# Patient Record
Sex: Female | Born: 1980
Health system: Southern US, Community
[De-identification: ages and names within clinical notes are randomized; demographics above are authoritative.]

## PROBLEM LIST (undated history)

## (undated) ENCOUNTER — Inpatient Hospital Stay (HOSPITAL_COMMUNITY): Payer: Self-pay

## (undated) DIAGNOSIS — F32A Depression, unspecified: Secondary | ICD-10-CM

## (undated) DIAGNOSIS — O1490 Unspecified pre-eclampsia, unspecified trimester: Secondary | ICD-10-CM

## (undated) DIAGNOSIS — Z8619 Personal history of other infectious and parasitic diseases: Secondary | ICD-10-CM

## (undated) DIAGNOSIS — Z8742 Personal history of other diseases of the female genital tract: Secondary | ICD-10-CM

## (undated) DIAGNOSIS — N840 Polyp of corpus uteri: Secondary | ICD-10-CM

## (undated) DIAGNOSIS — K5792 Diverticulitis of intestine, part unspecified, without perforation or abscess without bleeding: Secondary | ICD-10-CM

## (undated) DIAGNOSIS — F329 Major depressive disorder, single episode, unspecified: Secondary | ICD-10-CM

## (undated) DIAGNOSIS — D649 Anemia, unspecified: Secondary | ICD-10-CM

## (undated) DIAGNOSIS — F419 Anxiety disorder, unspecified: Secondary | ICD-10-CM

## (undated) DIAGNOSIS — T7840XA Allergy, unspecified, initial encounter: Secondary | ICD-10-CM

## (undated) DIAGNOSIS — R011 Cardiac murmur, unspecified: Secondary | ICD-10-CM

## (undated) HISTORY — DX: Allergy, unspecified, initial encounter: T78.40XA

## (undated) HISTORY — PX: WISDOM TOOTH EXTRACTION: SHX21

## (undated) HISTORY — DX: Unspecified pre-eclampsia, unspecified trimester: O14.90

## (undated) HISTORY — DX: Anemia, unspecified: D64.9

## (undated) HISTORY — PX: DILATION AND CURETTAGE OF UTERUS: SHX78

## (undated) HISTORY — PX: SMALL INTESTINE SURGERY: SHX150

## (undated) HISTORY — DX: Personal history of other diseases of the female genital tract: Z87.42

## (undated) HISTORY — DX: Major depressive disorder, single episode, unspecified: F32.9

## (undated) HISTORY — PX: ABDOMINAL HYSTERECTOMY: SHX81

## (undated) HISTORY — DX: Anxiety disorder, unspecified: F41.9

## (undated) HISTORY — DX: Depression, unspecified: F32.A

## (undated) HISTORY — DX: Cardiac murmur, unspecified: R01.1

## (undated) HISTORY — DX: Personal history of other infectious and parasitic diseases: Z86.19

---

## 2006-10-12 ENCOUNTER — Emergency Department (HOSPITAL_COMMUNITY): Admission: EM | Admit: 2006-10-12 | Discharge: 2006-10-13 | Payer: Self-pay | Admitting: Emergency Medicine

## 2006-11-30 ENCOUNTER — Emergency Department (HOSPITAL_COMMUNITY): Admission: EM | Admit: 2006-11-30 | Discharge: 2006-11-30 | Payer: Self-pay | Admitting: Emergency Medicine

## 2008-11-04 ENCOUNTER — Inpatient Hospital Stay (HOSPITAL_COMMUNITY): Admission: AD | Admit: 2008-11-04 | Discharge: 2008-11-06 | Payer: Self-pay | Admitting: Obstetrics and Gynecology

## 2009-12-26 ENCOUNTER — Emergency Department (HOSPITAL_COMMUNITY): Admission: EM | Admit: 2009-12-26 | Discharge: 2009-12-27 | Payer: Self-pay | Admitting: Emergency Medicine

## 2010-04-22 ENCOUNTER — Emergency Department (HOSPITAL_COMMUNITY)
Admission: EM | Admit: 2010-04-22 | Discharge: 2010-04-22 | Payer: Self-pay | Source: Home / Self Care | Admitting: Emergency Medicine

## 2010-07-27 LAB — URINALYSIS, ROUTINE W REFLEX MICROSCOPIC
Glucose, UA: NEGATIVE mg/dL
Ketones, ur: >80 mg/dL — AB
Leukocytes, UA: NEGATIVE
Nitrite: NEGATIVE
Protein, ur: NEGATIVE mg/dL
Specific Gravity, Urine: 1.025 (ref 1.005–1.030)
Urobilinogen, UA: 0.2 mg/dL (ref 0.0–1.0)
pH: 5.5 (ref 5.0–8.0)

## 2010-07-27 LAB — URINE MICROSCOPIC-ADD ON

## 2010-07-27 LAB — COMPREHENSIVE METABOLIC PANEL
ALT: 18 U/L (ref 0–35)
AST: 26 U/L (ref 0–37)
Albumin: 4.6 g/dL (ref 3.5–5.2)
Alkaline Phosphatase: 45 U/L (ref 39–117)
BUN: 9 mg/dL (ref 6–23)
CO2: 22 mEq/L (ref 19–32)
Calcium: 9 mg/dL (ref 8.4–10.5)
Chloride: 106 mEq/L (ref 96–112)
Creatinine, Ser: 0.75 mg/dL (ref 0.4–1.2)
GFR calc Af Amer: >60 mL/min (ref >60)
GFR calc non Af Amer: >60 mL/min (ref >60)
Glucose, Bld: 112 mg/dL — ABNORMAL HIGH (ref 70–99)
Potassium: 3.8 mEq/L (ref 3.5–5.1)
Sodium: 137 mEq/L (ref 135–145)
Total Bilirubin: 1.2 mg/dL (ref 0.3–1.2)
Total Protein: 9.1 g/dL — ABNORMAL HIGH (ref 6.0–8.3)

## 2010-07-27 LAB — CBC
HCT: 41.5 % (ref 36.0–46.0)
Hemoglobin: 14 g/dL (ref 12.0–15.0)
MCH: 29.5 pg (ref 26.0–34.0)
MCHC: 33.8 g/dL (ref 30.0–36.0)
MCV: 87.3 fL (ref 78.0–100.0)
Platelets: 227 10*3/uL (ref 150–400)
RBC: 4.75 MIL/uL (ref 3.87–5.11)
RDW: 13 % (ref 11.5–15.5)
WBC: 8 10*3/uL (ref 4.0–10.5)

## 2010-07-27 LAB — URINE CULTURE
Colony Count: 8000
Culture  Setup Time: 201108170150

## 2010-07-27 LAB — DIFFERENTIAL
Basophils Absolute: 0 10*3/uL (ref 0.0–0.1)
Basophils Relative: 0 % (ref 0–1)
Eosinophils Absolute: 0 10*3/uL (ref 0.0–0.7)
Neutrophils Relative %: 93 % — ABNORMAL HIGH (ref 43–77)

## 2010-07-27 LAB — POCT PREGNANCY, URINE: Preg Test, Ur: NEGATIVE

## 2010-08-20 LAB — CBC
HCT: 27.1 % — ABNORMAL LOW (ref 36.0–46.0)
HCT: 31.6 % — ABNORMAL LOW (ref 36.0–46.0)
Hemoglobin: 9.2 g/dL — ABNORMAL LOW (ref 12.0–15.0)
MCHC: 33.5 g/dL (ref 30.0–36.0)
MCHC: 34.1 g/dL (ref 30.0–36.0)
MCV: 90.8 fL (ref 78.0–100.0)
Platelets: 184 10*3/uL (ref 150–400)
RBC: 2.97 MIL/uL — ABNORMAL LOW (ref 3.87–5.11)
RDW: 13.2 % (ref 11.5–15.5)
RDW: 13.3 % (ref 11.5–15.5)
WBC: 11.4 10*3/uL — ABNORMAL HIGH (ref 4.0–10.5)
WBC: 9.5 10*3/uL (ref 4.0–10.5)

## 2011-02-25 LAB — URINALYSIS, ROUTINE W REFLEX MICROSCOPIC
Bilirubin Urine: NEGATIVE
Glucose, UA: NEGATIVE
Ketones, ur: NEGATIVE
Protein, ur: NEGATIVE
Specific Gravity, Urine: 1.014
Urobilinogen, UA: 1
pH: 6

## 2011-02-25 LAB — URINE MICROSCOPIC-ADD ON

## 2011-02-25 LAB — POCT PREGNANCY, URINE: Operator id: 284141

## 2013-07-27 LAB — OB RESULTS CONSOLE ANTIBODY SCREEN: Antibody Screen: NEGATIVE

## 2013-07-27 LAB — OB RESULTS CONSOLE RPR: RPR: NONREACTIVE

## 2013-07-27 LAB — OB RESULTS CONSOLE HIV ANTIBODY (ROUTINE TESTING): HIV: NONREACTIVE

## 2013-07-27 LAB — OB RESULTS CONSOLE GC/CHLAMYDIA
CHLAMYDIA, DNA PROBE: NEGATIVE
Gonorrhea: NEGATIVE

## 2013-07-27 LAB — OB RESULTS CONSOLE ABO/RH: RH Type: POSITIVE

## 2013-07-27 LAB — OB RESULTS CONSOLE HEPATITIS B SURFACE ANTIGEN: Hepatitis B Surface Ag: NEGATIVE

## 2013-07-27 LAB — OB RESULTS CONSOLE RUBELLA ANTIBODY, IGM: Rubella: IMMUNE

## 2013-11-25 ENCOUNTER — Inpatient Hospital Stay (HOSPITAL_COMMUNITY)
Admission: AD | Admit: 2013-11-25 | Discharge: 2013-11-25 | Disposition: A | Discharge status: Home / Self Care | Payer: Managed Care, Other (non HMO) | Source: Ambulatory Visit | Attending: Obstetrics and Gynecology | Admitting: Obstetrics and Gynecology

## 2013-11-25 ENCOUNTER — Encounter (HOSPITAL_COMMUNITY): Payer: Self-pay | Admitting: *Deleted

## 2013-11-25 DIAGNOSIS — O26859 Spotting complicating pregnancy, unspecified trimester: Secondary | ICD-10-CM | POA: Insufficient documentation

## 2013-11-25 DIAGNOSIS — M549 Dorsalgia, unspecified: Secondary | ICD-10-CM | POA: Insufficient documentation

## 2013-11-25 DIAGNOSIS — O4690 Antepartum hemorrhage, unspecified, unspecified trimester: Secondary | ICD-10-CM

## 2013-11-25 DIAGNOSIS — O4692 Antepartum hemorrhage, unspecified, second trimester: Secondary | ICD-10-CM

## 2013-11-25 LAB — URINALYSIS, ROUTINE W REFLEX MICROSCOPIC
Bilirubin Urine: NEGATIVE
GLUCOSE, UA: NEGATIVE mg/dL
Hgb urine dipstick: NEGATIVE
KETONES UR: NEGATIVE mg/dL
LEUKOCYTES UA: NEGATIVE
NITRITE: NEGATIVE
PH: 7 (ref 5.0–8.0)
PROTEIN: NEGATIVE mg/dL
Specific Gravity, Urine: 1.02 (ref 1.005–1.030)
Urobilinogen, UA: 1 mg/dL (ref 0.0–1.0)

## 2013-11-25 LAB — WET PREP, GENITAL
CLUE CELLS WET PREP: NONE SEEN
TRICH WET PREP: NONE SEEN
YEAST WET PREP: NONE SEEN

## 2013-11-25 MED ORDER — CYCLOBENZAPRINE HCL 10 MG PO TABS: 10.0000 mg | ORAL_TABLET | Freq: Two times a day (BID) | ORAL | Status: DC | PRN
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
## 2013-11-25 NOTE — MAU Note (Signed)
Pt states that she noticed vaginal bleeding this morning at 0700. Pt describes bleeding as "spotting" and bright pink accompanied by pelvic pressure. Pt reports positive fetal movement with no changes in frequency of movement. Pt denies SROM or gushing of vaginal fluid. Patient denies contractions. Patient states she has lower back pain that has persisted through pregnancy.

## 2013-11-25 NOTE — Discharge Instructions (Signed)
Keep next scheduled Obstetrics appointment. Call primary OB office with any concerns.  Pelvic Rest Pelvic rest is sometimes recommended for women when:   The placenta is partially or completely covering the opening of the cervix (placenta previa).  There is bleeding between the uterine wall and the amniotic sac in the first trimester (subchorionic hemorrhage).  The cervix begins to open without labor starting (incompetent cervix, cervical insufficiency).  The labor is too early (preterm labor). HOME CARE INSTRUCTIONS  Do not have sexual intercourse, stimulation, or an orgasm.  Do not use tampons, douche, or put anything in the vagina.  Do not lift anything over 10 pounds (4.5 kg).  Avoid strenuous activity or straining your pelvic muscles. SEEK MEDICAL CARE IF:  You have any vaginal bleeding during pregnancy. Treat this as a potential emergency.  You have cramping pain felt low in the stomach (stronger than menstrual cramps).  You notice vaginal discharge (watery, mucus, or bloody).  You have a low, dull backache.  There are regular contractions or uterine tightening. SEEK IMMEDIATE MEDICAL CARE IF: You have vaginal bleeding and have placenta previa.  Document Released: 08/24/2010 Document Revised: 07/22/2011 Document Reviewed: 08/24/2010 [facility] Patient Information 2015 [place], [place]. This information is not intended to replace advice given to you by your health care provider. Make sure you discuss any questions you have with your health care provider.  Vaginal Bleeding During Pregnancy, Second Trimester A small amount of bleeding (spotting) from the vagina is relatively common in pregnancy. It usually stops on its own. Various things can cause bleeding or spotting in pregnancy. Some bleeding may be related to the pregnancy, and some may not. Sometimes the bleeding is normal and is not a problem. However, bleeding can also be a sign of something serious. Be sure to tell your  health care provider about any vaginal bleeding right away. Some possible causes of vaginal bleeding during the second trimester include:  Infection, inflammation, or growths on the cervix.   The placenta may be partially or completely covering the opening of the cervix inside the uterus (placenta previa).  The placenta may have separated from the uterus (abruption of the placenta).   You may be having early (preterm) labor.   The cervix may not be strong enough to keep a baby inside the uterus (cervical insufficiency).   Tiny cysts may have developed in the uterus instead of pregnancy tissue (molar pregnancy). HOME CARE INSTRUCTIONS  Watch your condition for any changes. The following actions may help to lessen any discomfort you are feeling:  Follow your health care provider's instructions for limiting your activity. If your health care provider orders bed rest, you may need to stay in bed and only get up to use the bathroom. However, your health care provider may allow you to continue light activity.  If needed, make plans for someone to help with your regular activities and responsibilities while you are on bed rest.  Keep track of the number of pads you use each day, how often you change pads, and how soaked (saturated) they are. Write this down.  Do not use tampons. Do not douche.  Do not have sexual intercourse or orgasms until approved by your health care provider.  If you pass any tissue from your vagina, save the tissue so you can show it to your health care provider.  Only take over-the-counter or prescription medicines as directed by your health care provider.  Do not take aspirin because it can make you bleed.  Do not exercise or perform any strenuous activities or heavy lifting without your health care provider's permission.  Keep all follow-up appointments as directed by your health care provider. SEEK MEDICAL CARE IF:  You have any vaginal bleeding during  any part of your pregnancy.  You have cramps or labor pains.  You have a fever, not controlled by medicine. SEEK IMMEDIATE MEDICAL CARE IF:   You have severe cramps in your back or belly (abdomen).  You have contractions.  You have chills.  You pass large clots or tissue from your vagina.  Your bleeding increases.  You feel light-headed or weak, or you have fainting episodes.  You are leaking fluid or have a gush of fluid from your vagina. MAKE SURE YOU:  Understand these instructions.  Will watch your condition.  Will get help right away if you are not doing well or get worse. Document Released: 02/06/2005 Document Revised: 05/04/2013 Document Reviewed: 01/04/2013 [facility] Patient Information 2015 [place], [place]. This information is not intended to replace advice given to you by your health care provider. Make sure you discuss any questions you have with your health care provider. Back Pain in Pregnancy Back pain during pregnancy is common. It happens in about half of all pregnancies. It is important for you and your baby that you remain active during your pregnancy.If you feel that back pain is not allowing you to remain active or sleep well, it is time to see your caregiver. Back pain may be caused by several factors related to changes during your pregnancy.Fortunately, unless you had trouble with your back before your pregnancy, the pain is likely to get better after you deliver. Low back pain usually occurs between the fifth and seventh months of pregnancy. It can, however, happen in the first couple months. Factors that increase the risk of back problems include:   Previous back problems.  Injury to your back.  Having twins or multiple births.  A chronic cough.  Stress.  Job-related repetitive motions.  Muscle or spinal disease in the back.  Family history of back problems, ruptured (herniated) discs, or osteoporosis.  Depression, anxiety, and panic  attacks. CAUSES   When you are pregnant, your body produces a hormone called relaxin. This hormonemakes the ligaments connecting the low back and pubic bones more flexible. This flexibility allows the baby to be delivered more easily. When your ligaments are loose, your muscles need to work harder to support your back. Soreness in your back can come from tired muscles. Soreness can also come from back tissues that are irritated since they are receiving less support.  As the baby grows, it puts pressure on the nerves and blood vessels in your pelvis. This can cause back pain.  As the baby grows and gets heavier during pregnancy, the uterus pushes the stomach muscles forward and changes your center of gravity. This makes your back muscles work harder to maintain good posture. SYMPTOMS  Lumbar pain during pregnancy Lumbar pain during pregnancy usually occurs at or above the waist in the center of the back. There may be pain and numbness that radiates into your leg or foot. This is similar to low back pain experienced by non-pregnant women. It usually increases with sitting for long periods of time, standing, or repetitive lifting. Tenderness may also be present in the muscles along your upper back. Posterior pelvic pain during pregnancy Pain in the back of the pelvis is more common than lumbar pain in pregnancy. It is a deep pain  felt in your side at the waistline, or across the tailbone (sacrum), or in both places. You may have pain on one or both sides. This pain can also go into the buttocks and backs of the upper thighs. Pubic and groin pain may also be present. The pain does not quickly resolve with rest, and morning stiffness may also be present. Pelvic pain during pregnancy can be brought on by most activities. A high level of fitness before and during pregnancy may or may not prevent this problem. Labor pain is usually 1 to 2 minutes apart, lasts for about 1 minute, and involves a bearing down  feeling or pressure in your pelvis. However, if you are at term with the pregnancy, constant low back pain can be the beginning of early labor, and you should be aware of this. DIAGNOSIS  X-rays of the back should not be done during the first 12 to 14 weeks of the pregnancy and only when absolutely necessary during the rest of the pregnancy. MRIs do not give off radiation and are safe during pregnancy. MRIs also should only be done when absolutely necessary. HOME CARE INSTRUCTIONS  Exercise as directed by your caregiver. Exercise is the most effective way to prevent or manage back pain. If you have a back problem, it is especially important to avoid sports that require sudden body movements. Swimming and walking are great activities.  Do not stand in one place for long periods of time.  Do not wear high heels.  Sit in chairs with good posture. Use a pillow on your lower back if necessary. Make sure your head rests over your shoulders and is not hanging forward.  Try sleeping on your side, preferably the left side, with a pillow or two between your legs. If you are sore after a night's rest, your bedmay betoo soft.Try placing a board between your mattress and box spring.  Listen to your body when lifting.If you are experiencing pain, ask for help or try bending yourknees more so you can use your leg muscles rather than your back muscles. Squat down when picking up something from the floor. Do not bend over.  Eat a healthy diet. Try to gain weight within your caregiver's recommendations.  Use heat or cold packs 3 to 4 times a day for 15 minutes to help with the pain.  Only take over-the-counter or prescription medicines for pain, discomfort, or fever as directed by your caregiver. Sudden (acute) back pain  Use bed rest for only the most extreme, acute episodes of back pain. Prolonged bed rest over 48 hours will aggravate your condition.  Ice is very effective for acute conditions.  Put  ice in a plastic bag.  Place a towel between your skin and the bag.  Leave the ice on for 10 to 20 minutes every 2 hours, or as needed.  Using heat packs for 30 minutes prior to activities is also helpful. Continued back pain See your caregiver if you have continued problems. Your caregiver can help or refer you for appropriate physical therapy. With conditioning, most back problems can be avoided. Sometimes, a more serious issue may be the cause of back pain. You should be seen right away if new problems seem to be developing. Your caregiver may recommend:  A maternity girdle.  An elastic sling.  A back brace.  A massage therapist or acupuncture. SEEK MEDICAL CARE IF:   You are not able to do most of your daily activities, even when taking the  pain medicine you were given.  You need a referral to a physical therapist or chiropractor.  You want to try acupuncture. SEEK IMMEDIATE MEDICAL CARE IF:  You develop numbness, tingling, weakness, or problems with the use of your arms or legs.  You develop severe back pain that is no longer relieved with medicines.  You have a sudden change in bowel or bladder control.  You have increasing pain in other areas of the body.  You develop shortness of breath, dizziness, or fainting.  You develop nausea, vomiting, or sweating.  You have back pain which is similar to labor pains.  You have back pain along with your water breaking or vaginal bleeding.  You have back pain or numbness that travels down your leg.  Your back pain developed after you fell.  You develop pain on one side of your back. You may have a kidney stone.  You see blood in your urine. You may have a bladder infection or kidney stone.  You have back pain with blisters. You may have shingles. Back pain is fairly common during pregnancy but should not be accepted as just part of the process. Back pain should always be treated as soon as possible. This will make your  pregnancy as pleasant as possible. Document Released: 08/07/2005 Document Revised: 07/22/2011 Document Reviewed: 09/18/2010 [facility] Patient Information 2015 [place], [place]. This information is not intended to replace advice given to you by your health care provider. Make sure you discuss any questions you have with your health care provider.

## 2013-11-25 NOTE — MAU Note (Signed)
Patient states that she feels comfortable going home and following up with her primary OB physician.

## 2014-01-28 LAB — OB RESULTS CONSOLE GBS: STREP GROUP B AG: NEGATIVE

## 2014-02-16 ENCOUNTER — Encounter (HOSPITAL_COMMUNITY): Payer: Self-pay | Admitting: *Deleted

## 2014-02-16 ENCOUNTER — Telehealth (HOSPITAL_COMMUNITY): Payer: Self-pay | Admitting: *Deleted

## 2014-02-16 NOTE — Telephone Encounter (Signed)
Preadmission screen  

## 2014-02-21 ENCOUNTER — Inpatient Hospital Stay (HOSPITAL_COMMUNITY)
Admission: AD | Admit: 2014-02-21 | Discharge: 2014-02-23 | DRG: 775 | Disposition: A | Discharge status: Home / Self Care | Payer: Managed Care, Other (non HMO) | Source: Ambulatory Visit | Attending: Obstetrics and Gynecology | Admitting: Obstetrics and Gynecology

## 2014-02-21 ENCOUNTER — Encounter (HOSPITAL_COMMUNITY): Payer: Self-pay | Admitting: General Practice

## 2014-02-21 DIAGNOSIS — Z87891 Personal history of nicotine dependence: Secondary | ICD-10-CM

## 2014-02-21 DIAGNOSIS — Z833 Family history of diabetes mellitus: Secondary | ICD-10-CM | POA: Diagnosis not present

## 2014-02-21 DIAGNOSIS — Z8249 Family history of ischemic heart disease and other diseases of the circulatory system: Secondary | ICD-10-CM | POA: Diagnosis not present

## 2014-02-21 DIAGNOSIS — Z3A39 39 weeks gestation of pregnancy: Secondary | ICD-10-CM | POA: Diagnosis present

## 2014-02-21 DIAGNOSIS — IMO0001 Reserved for inherently not codable concepts without codable children: Secondary | ICD-10-CM

## 2014-02-21 DIAGNOSIS — Z3483 Encounter for supervision of other normal pregnancy, third trimester: Secondary | ICD-10-CM | POA: Diagnosis present

## 2014-02-21 DIAGNOSIS — Z823 Family history of stroke: Secondary | ICD-10-CM

## 2014-02-21 LAB — TYPE AND SCREEN
ABO/RH(D): B POS
Antibody Screen: NEGATIVE

## 2014-02-21 LAB — CBC
HEMATOCRIT: 32.7 % — AB (ref 36.0–46.0)
HEMOGLOBIN: 10.7 g/dL — AB (ref 12.0–15.0)
MCH: 29.2 pg (ref 26.0–34.0)
MCHC: 32.7 g/dL (ref 30.0–36.0)
MCV: 89.3 fL (ref 78.0–100.0)
Platelets: 209 10*3/uL (ref 150–400)
RBC: 3.66 MIL/uL — ABNORMAL LOW (ref 3.87–5.11)
RDW: 13 % (ref 11.5–15.5)
WBC: 11.1 10*3/uL — AB (ref 4.0–10.5)

## 2014-02-21 LAB — RPR

## 2014-02-21 LAB — ABO/RH: ABO/RH(D): B POS

## 2014-02-21 MED ORDER — OXYCODONE-ACETAMINOPHEN 5-325 MG PO TABS: 2.0000 | ORAL_TABLET | ORAL | Status: DC | PRN

## 2014-02-21 MED ORDER — ONDANSETRON HCL 4 MG/2ML IJ SOLN: 4.0000 mg | Freq: Four times a day (QID) | INTRAMUSCULAR | Status: DC | PRN

## 2014-02-21 MED ORDER — LACTATED RINGERS IV SOLN: INTRAVENOUS | Status: DC

## 2014-02-21 MED ORDER — LACTATED RINGERS IV SOLN
INTRAVENOUS | Status: DC
  Administered 2014-02-21: 11:00:00 via INTRAVENOUS

## 2014-02-21 MED ORDER — EPHEDRINE 5 MG/ML INJ
10.0000 mg | INTRAVENOUS | Status: DC | PRN
  Filled 2014-02-21: qty 2

## 2014-02-21 MED ORDER — OXYTOCIN 40 UNITS IN LACTATED RINGERS INFUSION - SIMPLE MED
62.5000 mL/h | INTRAVENOUS | Status: DC
  Filled 2014-02-21: qty 1000

## 2014-02-21 MED ORDER — FLEET ENEMA 7-19 GM/118ML RE ENEM: 1.0000 | ENEMA | RECTAL | Status: DC | PRN

## 2014-02-21 MED ORDER — LANOLIN HYDROUS EX OINT: TOPICAL_OINTMENT | CUTANEOUS | Status: DC | PRN

## 2014-02-21 MED ORDER — BISACODYL 10 MG RE SUPP
10.0000 mg | Freq: Every day | RECTAL | Status: DC | PRN
Start: 2014-02-21 — End: 2014-02-23

## 2014-02-21 MED ORDER — LACTATED RINGERS IV SOLN: 500.0000 mL | INTRAVENOUS | Status: DC | PRN

## 2014-02-21 MED ORDER — BENZOCAINE-MENTHOL 20-0.5 % EX AERO
1.0000 "application " | INHALATION_SPRAY | CUTANEOUS | Status: DC | PRN
  Administered 2014-02-22: 1 via TOPICAL
  Filled 2014-02-21 (×2): qty 56

## 2014-02-21 MED ORDER — TETANUS-DIPHTH-ACELL PERTUSSIS 5-2.5-18.5 LF-MCG/0.5 IM SUSP: 0.5000 mL | Freq: Once | INTRAMUSCULAR | Status: DC

## 2014-02-21 MED ORDER — ACETAMINOPHEN 325 MG PO TABS: 650.0000 mg | ORAL_TABLET | ORAL | Status: DC | PRN

## 2014-02-21 MED ORDER — DIBUCAINE 1 % RE OINT: 1.0000 "application " | TOPICAL_OINTMENT | RECTAL | Status: DC | PRN

## 2014-02-21 MED ORDER — IBUPROFEN 600 MG PO TABS
600.0000 mg | ORAL_TABLET | Freq: Four times a day (QID) | ORAL | Status: DC
  Administered 2014-02-21 – 2014-02-22 (×6): 600 mg via ORAL
  Filled 2014-02-21 (×7): qty 1

## 2014-02-21 MED ORDER — LACTATED RINGERS IV SOLN: 500.0000 mL | Freq: Once | INTRAVENOUS | Status: DC

## 2014-02-21 MED ORDER — FENTANYL 2.5 MCG/ML BUPIVACAINE 1/10 % EPIDURAL INFUSION (WH - ANES): 14.0000 mL/h | INTRAMUSCULAR | Status: DC | PRN

## 2014-02-21 MED ORDER — DIPHENHYDRAMINE HCL 25 MG PO CAPS: 25.0000 mg | ORAL_CAPSULE | Freq: Four times a day (QID) | ORAL | Status: DC | PRN

## 2014-02-21 MED ORDER — ONDANSETRON HCL 4 MG PO TABS
4.0000 mg | ORAL_TABLET | ORAL | Status: DC | PRN
Start: 2014-02-21 — End: 2014-02-23

## 2014-02-21 MED ORDER — DIPHENHYDRAMINE HCL 50 MG/ML IJ SOLN: 12.5000 mg | INTRAMUSCULAR | Status: DC | PRN

## 2014-02-21 MED ORDER — OXYTOCIN BOLUS FROM INFUSION
500.0000 mL | INTRAVENOUS | Status: DC
  Administered 2014-02-21: 500 mL via INTRAVENOUS

## 2014-02-21 MED ORDER — CITRIC ACID-SODIUM CITRATE 334-500 MG/5ML PO SOLN: 30.0000 mL | ORAL | Status: DC | PRN

## 2014-02-21 MED ORDER — ONDANSETRON HCL 4 MG/2ML IJ SOLN
4.0000 mg | INTRAMUSCULAR | Status: DC | PRN
Start: 2014-02-21 — End: 2014-02-23

## 2014-02-21 MED ORDER — PHENYLEPHRINE 40 MCG/ML (10ML) SYRINGE FOR IV PUSH (FOR BLOOD PRESSURE SUPPORT)
80.0000 ug | PREFILLED_SYRINGE | INTRAVENOUS | Status: DC | PRN
  Filled 2014-02-21: qty 2

## 2014-02-21 MED ORDER — OXYCODONE-ACETAMINOPHEN 5-325 MG PO TABS
2.0000 | ORAL_TABLET | ORAL | Status: DC | PRN
  Administered 2014-02-21 – 2014-02-23 (×4): 2 via ORAL
  Filled 2014-02-21 (×4): qty 2

## 2014-02-21 MED ORDER — ZOLPIDEM TARTRATE 5 MG PO TABS: 5.0000 mg | ORAL_TABLET | Freq: Every evening | ORAL | Status: DC | PRN

## 2014-02-21 MED ORDER — SIMETHICONE 80 MG PO CHEW: 80.0000 mg | CHEWABLE_TABLET | ORAL | Status: DC | PRN

## 2014-02-21 MED ORDER — OXYCODONE-ACETAMINOPHEN 5-325 MG PO TABS
1.0000 | ORAL_TABLET | ORAL | Status: DC | PRN
  Administered 2014-02-21 (×2): 1 via ORAL
  Filled 2014-02-21 (×2): qty 1

## 2014-02-21 MED ORDER — SENNOSIDES-DOCUSATE SODIUM 8.6-50 MG PO TABS
2.0000 | ORAL_TABLET | ORAL | Status: DC
  Administered 2014-02-22 (×2): 2 via ORAL
  Filled 2014-02-21 (×2): qty 2

## 2014-02-21 MED ORDER — LIDOCAINE HCL (PF) 1 % IJ SOLN
30.0000 mL | INTRAMUSCULAR | Status: AC | PRN
  Administered 2014-02-21: 30 mL via SUBCUTANEOUS
  Filled 2014-02-21: qty 30

## 2014-02-21 MED ORDER — OXYCODONE-ACETAMINOPHEN 5-325 MG PO TABS: 1.0000 | ORAL_TABLET | ORAL | Status: DC | PRN

## 2014-02-21 MED ORDER — WITCH HAZEL-GLYCERIN EX PADS: 1.0000 "application " | MEDICATED_PAD | CUTANEOUS | Status: DC | PRN

## 2014-02-21 MED ORDER — FLEET ENEMA 7-19 GM/118ML RE ENEM: 1.0000 | ENEMA | Freq: Every day | RECTAL | Status: DC | PRN

## 2014-02-21 MED ORDER — PRENATAL MULTIVITAMIN CH
1.0000 | ORAL_TABLET | Freq: Every day | ORAL | Status: DC
  Administered 2014-02-22: 1 via ORAL
  Filled 2014-02-21: qty 1

## 2014-02-21 NOTE — Progress Notes (Signed)
Delivery Note At 11:58 AM a viable female was delivered via Vaginal, Spontaneous Delivery (Presentation: ; Occiput Posterior).  APGAR: 9, 9; weight .   Placenta status: Intact, Spontaneous.  Cord: 3 vessels with the following complications: None.  Cord pH: pending  Anesthesia: Local  Episiotomy: None Lacerations: 2nd degree;Perineal;Periurethral both repaired Suture Repair: 2.0 vicryl rapide Est. Blood Loss (mL):   Mom to postpartum.  Baby to Couplet care / Skin to Skin.  Jordin Vicencio II,Ladon Vandenberghe E 02/21/2014, 12:25 PM

## 2014-02-21 NOTE — H&P (Signed)
Rebecca Lane is a 33 y.o. female presenting for contractions. Maternal Medical History:  Reason for admission: Contractions.   Contractions: Onset was 1-2 hours ago.    Fetal activity: Perceived fetal activity is normal.      OB History   Grav Para Term Preterm Abortions TAB SAB Ect Mult Living   2 1 1       1      Past Medical History  Diagnosis Date  . Anemia   . Hx of ovarian cyst   . Hx of varicella   . Heart murmur    Past Surgical History  Procedure Laterality Date  . No past surgeries     Family History: family history includes Diabetes in her maternal grandmother; Hypertension in her maternal grandmother and mother; Osteoporosis in her mother; Stroke in her maternal grandmother; Thrombophlebitis in her paternal grandfather. Social History:  reports that she has quit smoking. She does not have any smokeless tobacco history on file. She reports that she does not drink alcohol or use illicit drugs.   Prenatal Transfer Tool  Maternal Diabetes: No Genetic Screening: Normal Maternal Ultrasounds/Referrals: Normal Fetal Ultrasounds or other Referrals:  None Maternal Substance Abuse:  No Significant Maternal Medications:  None Significant Maternal Lab Results:  None Other Comments:  None  ROS  Dilation: 10 Effacement (%): 100 Station: -1 Exam by:: Early, RN Blood pressure 141/82, pulse 80, temperature 98 F (36.7 C), temperature source Oral, resp. rate 20, height 5\' 9"  (1.753 m), weight 92.08 kg (203 lb), last menstrual period 05/20/2013. Maternal Exam:  Uterine Assessment: Contraction strength is firm.  Contraction frequency is regular.   Abdomen: Fetal presentation: vertex     Fetal Exam Fetal State Assessment: Category I - tracings are normal.     Physical Exam  Cardiovascular: Normal rate.   Respiratory: Effort normal.  GI: Soft.    Prenatal labs: ABO, Rh: B/Positive/-- (03/17 0000) Antibody: Negative (03/17 0000) Rubella: Immune (03/17  0000) RPR: Nonreactive (03/17 0000)  HBsAg: Negative (03/17 0000)  HIV: Non-reactive (03/17 0000)  GBS: Negative (09/18 0000)   Assessment/Plan: 33 yo G2P1 in active labor Anticipate  Vaginal delivery   Rebecca Lane Rebecca Lane,Rebecca Lane E 02/21/2014, 12:23 PM

## 2014-02-21 NOTE — MAU Note (Signed)
Per Maia Plan, RN charge, pt to go to room 168

## 2014-02-22 ENCOUNTER — Inpatient Hospital Stay (HOSPITAL_COMMUNITY): Admission: RE | Admit: 2014-02-22 | Payer: Managed Care, Other (non HMO) | Source: Ambulatory Visit

## 2014-02-22 LAB — CBC
HEMATOCRIT: 27.4 % — AB (ref 36.0–46.0)
Hemoglobin: 9.1 g/dL — ABNORMAL LOW (ref 12.0–15.0)
MCH: 29.8 pg (ref 26.0–34.0)
MCHC: 33.2 g/dL (ref 30.0–36.0)
MCV: 89.8 fL (ref 78.0–100.0)
Platelets: 188 10*3/uL (ref 150–400)
RBC: 3.05 MIL/uL — ABNORMAL LOW (ref 3.87–5.11)
RDW: 13.2 % (ref 11.5–15.5)
WBC: 13.8 10*3/uL — ABNORMAL HIGH (ref 4.0–10.5)

## 2014-02-22 NOTE — Progress Notes (Signed)
Post Partum Day 1 Subjective: no complaints, up ad lib, voiding, tolerating PO, + flatus and initially had difficulty with voiding , in and out cath x1. Denies dysuria  Objective: Blood pressure 99/51, pulse 71, temperature 98.3 F (36.8 C), temperature source Oral, resp. rate 16, height 5\' 9"  (1.753 m), weight 203 lb (92.08 kg), last menstrual period 05/20/2013, SpO2 99.00%, unknown if currently breastfeeding.  Physical Exam:  General: alert and cooperative Lochia: appropriate Uterine Fundus: firm Incision: healing well DVT Evaluation: No evidence of DVT seen on physical exam. Negative Homan's sign. No cords or calf tenderness. No significant calf/ankle edema.   Recent Labs  02/21/14 1120 02/22/14 0600  HGB 10.7* 9.1*  HCT 32.7* 27.4*    Assessment/Plan: Plan for discharge tomorrow   LOS: 1 day   CURTIS,CAROL G 02/22/2014, 8:04 AM

## 2014-02-22 NOTE — Lactation Note (Signed)
This note was copied from the chart of Rebecca Nadine Scheu. Lactation Consultation Note Mom has personal pump she is using every three to four hrs after bottle feeding. Exclusively pump and bottle feeding. Has no desire to breast feeding. Encouraged fluids, sleep, and ambulating around. Mom pump and bottle fed her 1st child. Discussed supply and demand. Mom stated she only tried for 48 hrs. The first child. Encouraged to try much longer than than. Mom encouraged to feed baby 8-12 times/24 hours and with feeding cues. Mom reports + breast changes w/pregnancy. Mom encouraged to waken baby for feeds. Mom reports + breast changes w/pregnancy. [place] brochure given w/resources, support groups and [facility] services.Mom encouraged to feed baby w/feeding cues. Educated about newborn behavior. Reviewed Baby & Me book's Breastfeeding Basics.  Patient Name: Rebecca Lane MEQAS'T Date: 02/22/2014 Reason for consult: Initial assessment   Maternal Data    Feeding    LATCH Score/Interventions                      Lactation Tools Discussed/Used Pump Review: Setup, frequency, and cleaning;Milk Storage Initiated by:: RN Date initiated:: 02/21/14   Consult Status Consult Status: Follow-up Date: 02/22/14 Follow-up type: In-patient    Jasmon Graffam, Elta Guadeloupe 02/22/2014, 2:20 AM

## 2014-02-23 MED ORDER — IBUPROFEN 600 MG PO TABS: 600.0000 mg | ORAL_TABLET | Freq: Four times a day (QID) | ORAL | Status: DC

## 2014-02-23 MED ORDER — OXYCODONE-ACETAMINOPHEN 5-325 MG PO TABS: 1.0000 | ORAL_TABLET | ORAL | Status: DC | PRN

## 2014-02-23 NOTE — Progress Notes (Signed)
Post Partum Day 2 Subjective: no complaints, up ad lib, voiding and tolerating PO  Objective: Blood pressure 102/47, pulse 72, temperature 98.4 F (36.9 C), temperature source Oral, resp. rate 16, height 5\' 9"  (1.753 m), weight 203 lb (92.08 kg), last menstrual period 05/20/2013, SpO2 100.00%, unknown if currently breastfeeding.  Physical Exam:  General: alert, cooperative and appears stated age Lochia: appropriate Uterine Fundus: firm Incision: healing well DVT Evaluation: No evidence of DVT seen on physical exam. Negative Homan's sign. No cords or calf tenderness.   Recent Labs  02/21/14 1120 02/22/14 0600  HGB 10.7* 9.1*  HCT 32.7* 27.4*    Assessment/Plan: Discharge home   LOS: 2 days   Sofya Moustafa 02/23/2014, 8:43 AM

## 2014-02-23 NOTE — Discharge Summary (Signed)
Obstetric Discharge Summary Reason for Admission: onset of labor Prenatal Procedures: none Intrapartum Procedures: spontaneous vaginal delivery Postpartum Procedures: none Complications-Operative and Postpartum: 2nd degree perineal laceration Hemoglobin  Date Value Ref Range Status  02/22/2014 9.1* 12.0 - 15.0 g/dL Final     HCT  Date Value Ref Range Status  02/22/2014 27.4* 36.0 - 46.0 % Final    Physical Exam:  General: alert, cooperative and appears stated age 33: appropriate Uterine Fundus: firm Incision: healing well DVT Evaluation: No evidence of DVT seen on physical exam. Negative Homan's sign. No cords or calf tenderness.  Discharge Diagnoses: Term Pregnancy-delivered  Discharge Information: Date: 02/23/2014 Activity: pelvic rest Diet: routine Medications: PNV, Ibuprofen and Percocet Condition: stable Instructions: refer to practice specific booklet Discharge to: home   Newborn Data: Live born female  Birth Weight: 6 lb 13.7 oz (3110 g) APGAR: 9, 9  Home with mother.  Usman Millett, [place] 02/23/2014, 8:46 AM

## 2014-02-23 NOTE — Discharge Instructions (Signed)
Call MD for T>100.4, heavy vaginal bleeding, severe abdominal pain, intractable nausea and/or vomiting, or respiratory distress.  Call office to schedule postpartum visit in 4-6 weeks.  No driving while taking narcotics.  Pelvic rest x 6 weeks.

## 2014-03-14 ENCOUNTER — Encounter (HOSPITAL_COMMUNITY): Payer: Self-pay | Admitting: General Practice

## 2015-02-01 ENCOUNTER — Ambulatory Visit (INDEPENDENT_AMBULATORY_CARE_PROVIDER_SITE_OTHER): Payer: Managed Care, Other (non HMO) | Admitting: Emergency Medicine

## 2015-02-01 ENCOUNTER — Ambulatory Visit (INDEPENDENT_AMBULATORY_CARE_PROVIDER_SITE_OTHER): Payer: Managed Care, Other (non HMO)

## 2015-02-01 VITALS — BP 120/80 | HR 90 | Temp 99.4°F | Resp 18 | Ht 67.75 in | Wt 177.0 lb

## 2015-02-01 DIAGNOSIS — R059 Cough, unspecified: Secondary | ICD-10-CM

## 2015-02-01 DIAGNOSIS — J189 Pneumonia, unspecified organism: Secondary | ICD-10-CM | POA: Diagnosis not present

## 2015-02-01 DIAGNOSIS — R05 Cough: Secondary | ICD-10-CM | POA: Diagnosis not present

## 2015-02-01 MED ORDER — LEVOFLOXACIN 500 MG PO TABS: 500.0000 mg | ORAL_TABLET | Freq: Every day | ORAL | Status: AC

## 2015-02-01 MED ORDER — HYDROCOD POLST-CPM POLST ER 10-8 MG/5ML PO SUER: 5.0000 mL | Freq: Two times a day (BID) | ORAL | Status: DC

## 2015-02-01 NOTE — Patient Instructions (Signed)

## 2015-02-01 NOTE — Progress Notes (Signed)
Subjective:  Patient ID: Rebecca Lane, female    DOB: [birth year]  Age: 34 y.o. MRN: [id number]  CC: Cough; Headache; Fever; Chills; Fatigue; and Night Sweats   HPI Rebecca Lane presents  with a cough fever or chills. She's been ill for this and has had night sweats associated with with a cough. Unwilling to doing about until she was forced to come to Dr. by her coworkers. She she has a cough productive of green sputum. She has no wheezing or asthma exertional shortness breath. She has no chest pain tightness or heaviness. She has no nausea vomiting or stool change. No rash. She has had no improvement with over-the-counter medication  History Rebecca Lane has a past medical history of Anemia; ovarian cyst; varicella; Heart murmur; Allergy; and Depression.   She has past surgical history that includes No past surgeries.   Her  family history includes Diabetes in her maternal grandmother; Hypertension in her maternal grandmother and mother; Osteoporosis in her mother; Stroke in her maternal grandmother; Thrombophlebitis in her paternal grandfather.  She   reports that she has quit smoking. She does not have any smokeless tobacco history on file. She reports that she drinks about 0.6 oz of alcohol per week. She reports that she does not use illicit drugs.  Outpatient Prescriptions Prior to Visit  Medication Sig Dispense Refill  . acetaminophen (TYLENOL) 500 MG tablet Take 1,000 mg by mouth every 6 (six) hours as needed for mild pain or headache.     . cyclobenzaprine (FLEXERIL) 10 MG tablet Take 1 tablet (10 mg total) by mouth 2 (two) times daily as needed for muscle spasms. 40 tablet 1  . Fe Cbn-Fe Gluc-FA-B12-C-DSS (FERRALET 90) 90-1 MG TABS Take 1 tablet by mouth daily.    . calcium carbonate (TUMS - DOSED IN MG ELEMENTAL CALCIUM) 500 MG chewable tablet Chew 1 tablet by mouth 3 (three) times daily as needed for indigestion or heartburn.     Marland Kitchen ibuprofen (ADVIL,MOTRIN) 600 MG tablet Take 1  tablet (600 mg total) by mouth every 6 (six) hours. (Patient not taking: Reported on 02/01/2015) 30 tablet 0  . oxyCODONE-acetaminophen (PERCOCET/ROXICET) 5-325 MG per tablet Take 1 tablet by mouth every 4 (four) hours as needed (for pain scale less than 7). (Patient not taking: Reported on 02/01/2015) 20 tablet 0  . Prenatal Vit-Fe Fumarate-FA (PRENATAL MULTIVITAMIN) TABS tablet Take 1 tablet by mouth daily.     Marland Kitchen zolpidem (AMBIEN) 10 MG tablet Take 10 mg by mouth at bedtime as needed for sleep.     No facility-administered medications prior to visit.    Social History   Social History  . Marital Status: Married    Spouse Name: N/A  . Number of Children: N/A  . Years of Education: N/A   Social History Main Topics  . Smoking status: Former Research scientist (life sciences)  . Smokeless tobacco: None  . Alcohol Use: 0.6 oz/week    1 Standard drinks or equivalent per week  . Drug Use: No  . Sexual Activity: Not Asked   Other Topics Concern  . None   Social History Narrative     Review of Systems  Constitutional: Negative for fever, chills and appetite change.  HENT: Negative for congestion, ear pain, postnasal drip, sinus pressure and sore throat.   Eyes: Negative for pain and redness.  Respiratory: Positive for cough. Negative for shortness of breath and wheezing.   Cardiovascular: Negative for leg swelling.  Gastrointestinal: Negative for nausea, vomiting, abdominal pain,  diarrhea, constipation and blood in stool.  Endocrine: Negative for polyuria.  Genitourinary: Negative for dysuria, urgency, frequency and flank pain.  Musculoskeletal: Negative for gait problem.  Skin: Negative for rash.  Neurological: Negative for weakness and headaches.  Psychiatric/Behavioral: Negative for confusion and decreased concentration. The patient is not nervous/anxious.     Objective:  BP 120/80 mmHg  Pulse 90  Temp(Src) 99.4 F (37.4 C) (Oral)  Resp 18  Ht 5' 7.75" (1.721 m)  Wt 177 lb (80.287 kg)  BMI 27.11  kg/m2  SpO2 98%  LMP 01/18/2015 (Approximate)  Breastfeeding? No  Physical Exam  Constitutional: She is oriented to person, place, and time. She appears well-developed and well-nourished. No distress.  HENT:  Head: Normocephalic and atraumatic.  Right Ear: External ear normal.  Left Ear: External ear normal.  Nose: Nose normal.  Eyes: Conjunctivae and EOM are normal. Pupils are equal, round, and reactive to light. No scleral icterus.  Neck: Normal range of motion. Neck supple. No tracheal deviation present.  Cardiovascular: Normal rate, regular rhythm and normal heart sounds.   Pulmonary/Chest: Effort normal. No respiratory distress. She has no wheezes. She has rales (In the lung bases).  Abdominal: She exhibits no mass. There is no tenderness. There is no rebound and no guarding.  Musculoskeletal: She exhibits no edema.  Lymphadenopathy:    She has no cervical adenopathy.  Neurological: She is alert and oriented to person, place, and time. Coordination normal.  Skin: Skin is warm and dry. No rash noted.  Psychiatric: She has a normal mood and affect. Her behavior is normal.      Assessment & Plan:   Rebecca Lane was seen today for cough, headache, fever, chills, fatigue and night sweats.  Diagnoses and all orders for this visit:  CAP (community acquired pneumonia)  Cough -     DG Chest 2 View; Future  Other orders -     chlorpheniramine-HYDROcodone (TUSSIONEX PENNKINETIC ER) 10-8 MG/5ML SUER; Take 5 mLs by mouth 2 (two) times daily. -     levofloxacin (LEVAQUIN) 500 MG tablet; Take 1 tablet (500 mg total) by mouth daily.   I am having Rebecca Lane start on chlorpheniramine-HYDROcodone and levofloxacin. I am also having her maintain her prenatal multivitamin, calcium carbonate, acetaminophen, cyclobenzaprine, FERRALET 90, zolpidem, oxyCODONE-acetaminophen, ibuprofen, and drospirenone-ethinyl estradiol.  Meds ordered this encounter  Medications  . drospirenone-ethinyl estradiol  (OCELLA) 3-0.03 MG tablet    Sig: Take 1 tablet by mouth daily.  . chlorpheniramine-HYDROcodone (TUSSIONEX PENNKINETIC ER) 10-8 MG/5ML SUER    Sig: Take 5 mLs by mouth 2 (two) times daily.    Dispense:  60 mL    Refill:  0  . levofloxacin (LEVAQUIN) 500 MG tablet    Sig: Take 1 tablet (500 mg total) by mouth daily.    Dispense:  10 tablet    Refill:  0    Appropriate red flag conditions were discussed with the patient as well as actions that should be taken.  Patient expressed his understanding.  Follow-up: No Follow-up on file.  Roselee Culver, MD   [facility] reading (PRIMARY) by  Dr. Ouida Sills left lower lobe pneumonia.

## 2015-07-09 ENCOUNTER — Ambulatory Visit (INDEPENDENT_AMBULATORY_CARE_PROVIDER_SITE_OTHER): Payer: 59 | Admitting: Urgent Care

## 2015-07-09 VITALS — BP 116/74 | HR 82 | Temp 98.4°F | Resp 12 | Ht 67.0 in | Wt 187.0 lb

## 2015-07-09 DIAGNOSIS — J3489 Other specified disorders of nose and nasal sinuses: Secondary | ICD-10-CM

## 2015-07-09 DIAGNOSIS — R0981 Nasal congestion: Secondary | ICD-10-CM

## 2015-07-09 DIAGNOSIS — H65191 Other acute nonsuppurative otitis media, right ear: Secondary | ICD-10-CM | POA: Diagnosis not present

## 2015-07-09 MED ORDER — CETIRIZINE HCL 10 MG PO TABS: 10.0000 mg | ORAL_TABLET | Freq: Every day | ORAL | Status: DC

## 2015-07-09 MED ORDER — AZITHROMYCIN 250 MG PO TABS: ORAL_TABLET | ORAL | Status: DC

## 2015-07-09 NOTE — Progress Notes (Signed)
    MRN: [id number] DOB: 1981-03-27  Subjective:   Rebecca Lane is a 35 y.o. female presenting for chief complaint of Ear Fullness  Reports ~2 week history of right ear fullness, ear pain, decreased hearing, now having 1 day history of right sided facial pressure, sinus pain, headache. Has used peroxide drops, otc wax removal. Denies fever, cough, sore throat, tooth pain. Denies history of chronic ear infections or tympanostomy. Patient has not been swimming recently.  Leo has a current medication list which includes the following prescription(s): carbonyl iron, drospirenone-ethinyl estradiol, and ferralet 90. Also is allergic to penicillins.  Anallely  has a past medical history of Anemia; ovarian cyst; varicella; Heart murmur; Allergy; and Depression. Also  has past surgical history that includes No past surgeries.  Objective:   Vitals: BP 116/74 mmHg  Pulse 82  Temp(Src) 98.4 F (36.9 C) (Oral)  Resp 12  Ht 5\' 7"  (1.702 m)  Wt 187 lb (84.823 kg)  BMI 29.28 kg/m2  SpO2 99%  LMP 07/09/2015  Physical Exam  Constitutional: She is oriented to person, place, and time. She appears well-developed and well-nourished.  HENT:  Right TM with yellowish-brown tinge, non-tender, no drainage of pus or bleeding. Left TM normal. Nasal turbinates pink and moist. No sinus tenderness. Oropharynx clear.  Eyes: Right eye exhibits no discharge. Left eye exhibits no discharge. No scleral icterus.  Neck: Normal range of motion. Neck supple.  Cardiovascular: Normal rate.   Pulmonary/Chest: Effort normal.  Musculoskeletal: She exhibits no edema.  Lymphadenopathy:    She has no cervical adenopathy.  Neurological: She is alert and oriented to person, place, and time.  Skin: Skin is warm and dry.   Assessment and Plan :   1. Acute nonsuppurative otitis media of right ear 2. Sinus congestion 3. Sinus pain - Due to penicillin allergy, will start Azithromycin. Also recommended Zyrtec to address nasal  congestion. Patient is to call and let me know if no improvement in 3-4 days, consider switching to Cipro or referral to ENT for further management. Patient agreed.  Jaynee Eagles, PA-C [facility] [phone] 07/09/2015 1:41 PM

## 2015-07-09 NOTE — Patient Instructions (Signed)

## 2016-03-06 ENCOUNTER — Encounter (HOSPITAL_COMMUNITY): Payer: Self-pay | Admitting: Emergency Medicine

## 2016-03-06 ENCOUNTER — Emergency Department (HOSPITAL_COMMUNITY): Payer: 59

## 2016-03-06 ENCOUNTER — Emergency Department (HOSPITAL_COMMUNITY)
Admission: EM | Admit: 2016-03-06 | Discharge: 2016-03-06 | Disposition: A | Discharge status: Home / Self Care | Payer: 59 | Attending: Emergency Medicine | Admitting: Emergency Medicine

## 2016-03-06 ENCOUNTER — Ambulatory Visit (INDEPENDENT_AMBULATORY_CARE_PROVIDER_SITE_OTHER): Payer: 59 | Admitting: Family Medicine

## 2016-03-06 VITALS — BP 124/80 | HR 91 | Temp 98.6°F | Resp 17 | Ht 67.0 in | Wt 187.0 lb

## 2016-03-06 DIAGNOSIS — R1013 Epigastric pain: Secondary | ICD-10-CM | POA: Diagnosis present

## 2016-03-06 DIAGNOSIS — Z79899 Other long term (current) drug therapy: Secondary | ICD-10-CM | POA: Insufficient documentation

## 2016-03-06 DIAGNOSIS — K5792 Diverticulitis of intestine, part unspecified, without perforation or abscess without bleeding: Secondary | ICD-10-CM | POA: Insufficient documentation

## 2016-03-06 DIAGNOSIS — R1033 Periumbilical pain: Secondary | ICD-10-CM | POA: Diagnosis not present

## 2016-03-06 DIAGNOSIS — Z87891 Personal history of nicotine dependence: Secondary | ICD-10-CM | POA: Diagnosis not present

## 2016-03-06 LAB — COMPREHENSIVE METABOLIC PANEL
ALBUMIN: 4.5 g/dL (ref 3.5–5.0)
ALK PHOS: 38 U/L (ref 38–126)
ALT: 14 U/L (ref 14–54)
AST: 17 U/L (ref 15–41)
Anion gap: 7 (ref 5–15)
BILIRUBIN TOTAL: 1.3 mg/dL — AB (ref 0.3–1.2)
BUN: 6 mg/dL (ref 6–20)
CO2: 22 mmol/L (ref 22–32)
CREATININE: 0.65 mg/dL (ref 0.44–1.00)
Calcium: 8.6 mg/dL — ABNORMAL LOW (ref 8.9–10.3)
Chloride: 103 mmol/L (ref 101–111)
GFR calc Af Amer: >60 mL/min (ref >60)
GLUCOSE: 97 mg/dL (ref 65–99)
Potassium: 3.7 mmol/L (ref 3.5–5.1)
Sodium: 132 mmol/L — ABNORMAL LOW (ref 135–145)
Total Protein: 8.1 g/dL (ref 6.5–8.1)

## 2016-03-06 LAB — I-STAT BETA HCG BLOOD, ED (MC, WL, AP ONLY): I-stat hCG, quantitative: <5 m[IU]/mL (ref <5)

## 2016-03-06 LAB — CBC
HEMATOCRIT: 37.8 % (ref 36.0–46.0)
Hemoglobin: 12.2 g/dL (ref 12.0–15.0)
MCH: 28.9 pg (ref 26.0–34.0)
MCHC: 32.3 g/dL (ref 30.0–36.0)
MCV: 89.6 fL (ref 78.0–100.0)
Platelets: 240 10*3/uL (ref 150–400)
RBC: 4.22 MIL/uL (ref 3.87–5.11)
RDW: 13.1 % (ref 11.5–15.5)
WBC: 11.3 10*3/uL — AB (ref 4.0–10.5)

## 2016-03-06 LAB — URINALYSIS, ROUTINE W REFLEX MICROSCOPIC
BILIRUBIN URINE: NEGATIVE
GLUCOSE, UA: NEGATIVE mg/dL
Ketones, ur: 40 mg/dL — AB
Leukocytes, UA: NEGATIVE
NITRITE: NEGATIVE
Protein, ur: NEGATIVE mg/dL
SPECIFIC GRAVITY, URINE: 1.011 (ref 1.005–1.030)
pH: 6 (ref 5.0–8.0)

## 2016-03-06 LAB — URINE MICROSCOPIC-ADD ON

## 2016-03-06 LAB — LIPASE, BLOOD: Lipase: 18 U/L (ref 11–51)

## 2016-03-06 MED ORDER — CIPROFLOXACIN HCL 500 MG PO TABS: 500.0000 mg | ORAL_TABLET | Freq: Two times a day (BID) | ORAL | 0 refills | Status: DC

## 2016-03-06 MED ORDER — CEPHALEXIN 500 MG PO CAPS
500.0000 mg | ORAL_CAPSULE | Freq: Once | ORAL | Status: AC
  Administered 2016-03-06: 500 mg via ORAL
  Filled 2016-03-06: qty 1

## 2016-03-06 MED ORDER — PANTOPRAZOLE SODIUM 40 MG IV SOLR
40.0000 mg | Freq: Once | INTRAVENOUS | Status: AC
  Administered 2016-03-06: 40 mg via INTRAVENOUS
  Filled 2016-03-06: qty 40

## 2016-03-06 MED ORDER — IOPAMIDOL (ISOVUE-300) INJECTION 61%
15.0000 mL | Freq: Once | INTRAVENOUS | Status: AC | PRN
  Administered 2016-03-06: 15 mL via ORAL

## 2016-03-06 MED ORDER — ONDANSETRON HCL 4 MG/2ML IJ SOLN
4.0000 mg | Freq: Once | INTRAMUSCULAR | Status: AC
  Administered 2016-03-06: 4 mg via INTRAVENOUS
  Filled 2016-03-06: qty 2

## 2016-03-06 MED ORDER — METRONIDAZOLE 500 MG PO TABS
500.0000 mg | ORAL_TABLET | Freq: Once | ORAL | Status: AC
  Administered 2016-03-06: 500 mg via ORAL
  Filled 2016-03-06: qty 1

## 2016-03-06 MED ORDER — ONDANSETRON 4 MG PO TBDP: 4.0000 mg | ORAL_TABLET | Freq: Three times a day (TID) | ORAL | 0 refills | Status: DC | PRN

## 2016-03-06 MED ORDER — HYDROMORPHONE HCL 1 MG/ML IJ SOLN
1.0000 mg | Freq: Once | INTRAMUSCULAR | Status: AC
  Administered 2016-03-06: 1 mg via INTRAVENOUS
  Filled 2016-03-06: qty 1

## 2016-03-06 MED ORDER — HYDROCODONE-ACETAMINOPHEN 5-325 MG PO TABS: 1.0000 | ORAL_TABLET | Freq: Four times a day (QID) | ORAL | 0 refills | Status: DC | PRN

## 2016-03-06 MED ORDER — LORAZEPAM 2 MG/ML IJ SOLN
0.5000 mg | Freq: Once | INTRAMUSCULAR | Status: AC
  Administered 2016-03-06: 0.5 mg via INTRAVENOUS
  Filled 2016-03-06: qty 1

## 2016-03-06 MED ORDER — IOPAMIDOL (ISOVUE-300) INJECTION 61%
100.0000 mL | Freq: Once | INTRAVENOUS | Status: AC | PRN
  Administered 2016-03-06: 100 mL via INTRAVENOUS

## 2016-03-06 MED ORDER — METRONIDAZOLE 500 MG PO TABS: 500.0000 mg | ORAL_TABLET | Freq: Two times a day (BID) | ORAL | 0 refills | Status: DC

## 2016-03-06 NOTE — ED Notes (Signed)
Pt being sent by Urgent [facility].  C/o epigastric pain x 2 days and nausea.  Pt reported to UC that pain increased after eating.

## 2016-03-06 NOTE — Progress Notes (Signed)
Patient listed as having [employer] without a pcp.  EDCM went to speak to patient at bedside, however, nurse at bedside performing procedure.

## 2016-03-06 NOTE — Patient Instructions (Addendum)
Great to meet you!  Please go to the emergency room to be evaluated, I feel confident you need a CT of your abdomen.    IF you received an x-ray today, you will receive an invoice from [facility]. Please contact [facility] at [phone] with questions or concerns regarding your invoice.   IF you received labwork today, you will receive an invoice from [employer]. Please contact [facility] at [phone] with questions or concerns regarding your invoice.   Our billing staff will not be able to assist you with questions regarding bills from these companies.  You will be contacted with the lab results as soon as they are available. The fastest way to get your results is to activate your My Chart account. Instructions are located on the last page of this paperwork. If you have not heard from [place] regarding the results in 2 weeks, please contact this office.

## 2016-03-06 NOTE — ED Notes (Signed)
Po challenge initiated, water given, patient instructed to take small sips.

## 2016-03-06 NOTE — ED Provider Notes (Signed)
I received pt in signout from Dr. Zenia Resides. We were awaiting CT results, which showed diverticulitis without any complications. Patient well-appearing on reexamination. Given her normal vital signs, reassuring lab work, and no evidence of perforation on CT, I feel she is appropriate for outpatient management of diverticulitis with Cipro and Flagyl. Gave her dose in the ED and discussed supportive care. I extensively reviewed return precautions including any worsening symptoms. She voiced understanding and was discharged in satisfactory condition.   Sharlett Iles, MD 03/06/16 2110

## 2016-03-06 NOTE — ED Notes (Signed)
Pt cannot use restroom at this time, aware urine specimen is needed.  

## 2016-03-06 NOTE — Progress Notes (Addendum)
   HPI  Patient presents today here with abdominal pain.  Patient explains that 2 days ago she began having epigastric abdominal pain which has now migrated to her periumbilical area. She has severe worsening with eating or drinking. She has loose stools with 4 episodes daily since onset, no vomiting but she does have emesis.  Her stools are nonbloody and non-melanotic.  She has no fevers but she does have chills.  Her pain has been worsening today.  She took a cimetidine with a small amount of relief last night and this morning.  She denies any new foods, she has not drank alcohol recently, she is not a smoker. She has never had similar pain.   PMH: Smoking status noted ROS: Per HPI  Objective: BP 124/80 (BP Location: Right Arm, Patient Position: Sitting, Cuff Size: Normal)   Pulse 91   Temp 98.6 F (37 C) (Oral)   Resp 17   Ht 5\' 7"  (1.702 m)   Wt 187 lb (84.8 kg)   LMP 02/14/2016 (Approximate)   SpO2 100%   BMI 29.29 kg/m  Gen: NAD, alert, cooperative with exam HEENT: NCAT, tearful during abdominal exam, oropharynx moist  CV: RRR, good S1/S2, no murmur Resp: CTABL, no wheezes, non-labored [id number], tenderness to palpation throughout but most severe in the right lower quadrant, para umbilical area, and left lower quadrant. guarding throughout, positive bowel sounds  Ext: No edema, warm Neuro: Alert and oriented, No gross deficits  Assessment and plan:  # Abdominal pain, periumbilical Severe worsening abdominal pain, her abdominal exam is concerning for serious intra-abdominal etiology. On exam she is guarding and tearful, her pain is mostly centered around the periumbilical area currently causing concern for acute appendicitis. consider gastritis as well given improvent with H2 blocker, however given the severity of her pain with exam I'm uncomfortable sending her home without further evaluation. I have recommended transfer to the emergency room by car which she agrees  to do.    Meds ordered this encounter  Medications  . cimetidine (TAGAMET) 200 MG tablet    Sig: Take 200 mg by mouth 2 (two) times daily.    Laroy Apple, MD  03/06/2016, 3:30 PM

## 2016-03-06 NOTE — Discharge Instructions (Signed)
Do not drink alcohol while taking these antibiotics. Return immediately if you have worsening symptoms including worsening abdominal pain, fever, blood in your stool, or severe vomiting.

## 2016-03-06 NOTE — ED Triage Notes (Addendum)
Pt c/o abdominal pain in center but is now more of a sore pain onset 2 days ago. States laying on her back and when she eats/drinks it  Increases her pain level. Pt c/o nausea and diarrhea. Pt was at [facility] and sent her here for a CT scan. No lab work was done at [facility].

## 2016-03-06 NOTE — ED Provider Notes (Signed)
[facility] DEPT Provider Note   CSN: [id number] Arrival date & time: 03/06/16  1554     History   Chief Complaint Chief Complaint  Patient presents with  . Abdominal Pain    HPI Rebecca Lane is a 35 y.o. female.  35 year old female presents with 2 days of epigastric abdominal pain that is worse with eating does not radiate to her periumbilical area as well as right lower quadrant. She's had nausea but no vomiting. Some diarrhea. Denies any fever or chills. No vaginal bleeding or discharge. Denies any urinary symptoms. Was seen in urgent care center and then that was reviewed. She was sent here for possible appendicitis.      Past Medical History:  Diagnosis Date  . Allergy   . Anemia   . Depression   . Heart murmur   . Hx of ovarian cyst   . Hx of varicella     There are no active problems to display for this patient.   Past Surgical History:  Procedure Laterality Date  . NO PAST SURGERIES      OB History    Gravida Para Term Preterm AB Living   2 2 2     2    SAB TAB Ectopic Multiple Live Births           2       Home Medications    Prior to Admission medications   Medication Sig Start Date End Date Taking? Authorizing Provider  Carbonyl Iron (FEOSOL PO) Take by mouth daily.    Historical Provider, MD  cimetidine (TAGAMET) 200 MG tablet Take 200 mg by mouth 2 (two) times daily.    Historical Provider, MD  drospirenone-ethinyl estradiol (OCELLA) 3-0.03 MG tablet Take 1 tablet by mouth daily.    Historical Provider, MD    Family History Family History  Problem Relation Age of Onset  . Hypertension Mother   . Osteoporosis Mother   . Stroke Maternal Grandmother   . Hypertension Maternal Grandmother   . Diabetes Maternal Grandmother   . Thrombophlebitis Paternal Grandfather     Social History Social History  Substance Use Topics  . Smoking status: Former Research scientist (life sciences)  . Smokeless tobacco: Not on file  . Alcohol use 0.6 oz/week    1 Standard  drinks or equivalent per week     Allergies   Penicillins   Review of Systems Review of Systems  All other systems reviewed and are negative.    Physical Exam Updated Vital Signs BP 125/82 (BP Location: Left Arm)   Pulse 85   Temp 98.6 F (37 C) (Oral)   Resp 18   Ht 5\' 7"  (1.702 m)   Wt 88.6 kg   LMP 02/14/2016 (Approximate)   SpO2 99%   BMI 30.58 kg/m   Physical Exam  Constitutional: She is oriented to person, place, and time. She appears well-developed and well-nourished.  Non-toxic appearance. No distress.  HENT:  Head: Normocephalic and atraumatic.  Eyes: Conjunctivae, EOM and lids are normal. Pupils are equal, round, and reactive to light.  Neck: Normal range of motion. Neck supple. No tracheal deviation present. No thyroid mass present.  Cardiovascular: Normal rate, regular rhythm and normal heart sounds.  Exam reveals no gallop.   No murmur heard. Pulmonary/Chest: Effort normal and breath sounds normal. No stridor. No respiratory distress. She has no decreased breath sounds. She has no wheezes. She has no rhonchi. She has no rales.  Abdominal: Soft. Normal appearance and bowel sounds are normal.  She exhibits no distension. There is tenderness in the right lower quadrant and epigastric area. There is guarding. There is no rigidity, no rebound and no CVA tenderness.  Musculoskeletal: Normal range of motion. She exhibits no edema or tenderness.  Neurological: She is alert and oriented to person, place, and time. She has normal strength. No cranial nerve deficit or sensory deficit. GCS eye subscore is 4. GCS verbal subscore is 5. GCS motor subscore is 6.  Skin: Skin is warm and dry. No abrasion and no rash noted.  Psychiatric: She has a normal mood and affect. Her speech is normal and behavior is normal.  Nursing note and vitals reviewed.    ED Treatments / Results  Labs (all labs ordered are listed, but only abnormal results are displayed) Labs Reviewed    COMPREHENSIVE METABOLIC PANEL - Abnormal; Notable for the following:       Result Value   Sodium 132 (*)    Calcium 8.6 (*)    Total Bilirubin 1.3 (*)    All other components within normal limits  CBC - Abnormal; Notable for the following:    WBC 11.3 (*)    All other components within normal limits  LIPASE, BLOOD  URINALYSIS, ROUTINE W REFLEX MICROSCOPIC (NOT AT [facility])  I-STAT BETA HCG BLOOD, ED ([facility], [facility], [facility] ONLY)    EKG  EKG Interpretation None       Radiology No results found.  Procedures Procedures (including critical care time)  Medications Ordered in ED Medications  iopamidol (ISOVUE-300) 61 % injection 15 mL (not administered)  HYDROmorphone (DILAUDID) injection 1 mg (not administered)  LORazepam (ATIVAN) injection 0.5 mg (not administered)  ondansetron (ZOFRAN) injection 4 mg (not administered)     Initial Impression / Assessment and Plan / ED Course  I have reviewed the triage vital signs and the nursing notes.  Pertinent labs & imaging results that were available during my care of the patient were reviewed by me and considered in my medical decision making (see chart for details).  Clinical Course    Patient medicated for pain. Abdominal CT ordered and results will be pending at this time and care signed out to oncoming provider  Final Clinical Impressions(s) / ED Diagnoses   Final diagnoses:  None    New Prescriptions New Prescriptions   No medications on file     Lacretia Leigh, MD 03/06/16 1724

## 2016-04-03 ENCOUNTER — Emergency Department (HOSPITAL_COMMUNITY)
Admission: EM | Admit: 2016-04-03 | Discharge: 2016-04-03 | Disposition: A | Discharge status: Home / Self Care | Payer: 59 | Attending: Emergency Medicine | Admitting: Emergency Medicine

## 2016-04-03 ENCOUNTER — Emergency Department (HOSPITAL_COMMUNITY): Payer: 59

## 2016-04-03 ENCOUNTER — Encounter (HOSPITAL_COMMUNITY): Payer: Self-pay | Admitting: Emergency Medicine

## 2016-04-03 DIAGNOSIS — R197 Diarrhea, unspecified: Secondary | ICD-10-CM | POA: Diagnosis not present

## 2016-04-03 DIAGNOSIS — Z87891 Personal history of nicotine dependence: Secondary | ICD-10-CM | POA: Insufficient documentation

## 2016-04-03 DIAGNOSIS — R112 Nausea with vomiting, unspecified: Secondary | ICD-10-CM

## 2016-04-03 DIAGNOSIS — Z79899 Other long term (current) drug therapy: Secondary | ICD-10-CM | POA: Diagnosis not present

## 2016-04-03 DIAGNOSIS — R1013 Epigastric pain: Secondary | ICD-10-CM | POA: Diagnosis not present

## 2016-04-03 HISTORY — DX: Diverticulitis of intestine, part unspecified, without perforation or abscess without bleeding: K57.92

## 2016-04-03 LAB — WET PREP, GENITAL
Clue Cells Wet Prep HPF POC: NONE SEEN
SPERM: NONE SEEN
Trich, Wet Prep: NONE SEEN
Yeast Wet Prep HPF POC: NONE SEEN

## 2016-04-03 LAB — URINALYSIS, ROUTINE W REFLEX MICROSCOPIC
Bilirubin Urine: NEGATIVE
Glucose, UA: NEGATIVE mg/dL
Ketones, ur: 15 mg/dL — AB
LEUKOCYTES UA: NEGATIVE
NITRITE: NEGATIVE
PROTEIN: NEGATIVE mg/dL
Specific Gravity, Urine: 1.021 (ref 1.005–1.030)
pH: 5.5 (ref 5.0–8.0)

## 2016-04-03 LAB — CBC
HCT: 39.5 % (ref 36.0–46.0)
Hemoglobin: 12.7 g/dL (ref 12.0–15.0)
MCH: 28.6 pg (ref 26.0–34.0)
MCHC: 32.2 g/dL (ref 30.0–36.0)
MCV: 89 fL (ref 78.0–100.0)
PLATELETS: 203 10*3/uL (ref 150–400)
RBC: 4.44 MIL/uL (ref 3.87–5.11)
RDW: 13.4 % (ref 11.5–15.5)
WBC: 6.2 10*3/uL (ref 4.0–10.5)

## 2016-04-03 LAB — COMPREHENSIVE METABOLIC PANEL
ALT: 14 U/L (ref 14–54)
AST: 18 U/L (ref 15–41)
Albumin: 4.6 g/dL (ref 3.5–5.0)
Alkaline Phosphatase: 41 U/L (ref 38–126)
Anion gap: 7 (ref 5–15)
BILIRUBIN TOTAL: 1 mg/dL (ref 0.3–1.2)
BUN: 9 mg/dL (ref 6–20)
CALCIUM: 8.7 mg/dL — AB (ref 8.9–10.3)
CHLORIDE: 108 mmol/L (ref 101–111)
CO2: 21 mmol/L — ABNORMAL LOW (ref 22–32)
CREATININE: 0.68 mg/dL (ref 0.44–1.00)
Glucose, Bld: 91 mg/dL (ref 65–99)
Potassium: 3.8 mmol/L (ref 3.5–5.1)
Sodium: 136 mmol/L (ref 135–145)
TOTAL PROTEIN: 8.1 g/dL (ref 6.5–8.1)

## 2016-04-03 LAB — URINE MICROSCOPIC-ADD ON

## 2016-04-03 LAB — LIPASE, BLOOD: LIPASE: 23 U/L (ref 11–51)

## 2016-04-03 LAB — POC URINE PREG, ED: PREG TEST UR: NEGATIVE

## 2016-04-03 MED ORDER — HYDROCODONE-ACETAMINOPHEN 5-325 MG PO TABS: 1.0000 | ORAL_TABLET | Freq: Four times a day (QID) | ORAL | 0 refills | Status: DC | PRN

## 2016-04-03 MED ORDER — ONDANSETRON HCL 4 MG PO TABS: 4.0000 mg | ORAL_TABLET | Freq: Four times a day (QID) | ORAL | 0 refills | Status: DC

## 2016-04-03 MED ORDER — ONDANSETRON HCL 4 MG/2ML IJ SOLN
4.0000 mg | Freq: Once | INTRAMUSCULAR | Status: AC
  Administered 2016-04-03: 4 mg via INTRAVENOUS
  Filled 2016-04-03: qty 2

## 2016-04-03 MED ORDER — IOPAMIDOL (ISOVUE-300) INJECTION 61%
30.0000 mL | Freq: Once | INTRAVENOUS | Status: AC | PRN
  Administered 2016-04-03: 30 mL via ORAL

## 2016-04-03 MED ORDER — OMEPRAZOLE 20 MG PO CPDR: 40.0000 mg | DELAYED_RELEASE_CAPSULE | Freq: Every day | ORAL | 0 refills | Status: DC

## 2016-04-03 MED ORDER — LOPERAMIDE HCL 2 MG PO CAPS: 2.0000 mg | ORAL_CAPSULE | Freq: Four times a day (QID) | ORAL | 0 refills | Status: DC | PRN

## 2016-04-03 MED ORDER — HYDROMORPHONE HCL 1 MG/ML IJ SOLN
1.0000 mg | Freq: Once | INTRAMUSCULAR | Status: AC
  Administered 2016-04-03: 1 mg via INTRAVENOUS
  Filled 2016-04-03: qty 1

## 2016-04-03 MED ORDER — SODIUM CHLORIDE 0.9 % IV BOLUS (SEPSIS)
1000.0000 mL | Freq: Once | INTRAVENOUS | Status: AC
  Administered 2016-04-03: 1000 mL via INTRAVENOUS

## 2016-04-03 MED ORDER — GI COCKTAIL ~~LOC~~
30.0000 mL | Freq: Once | ORAL | Status: AC
  Administered 2016-04-03: 30 mL via ORAL
  Filled 2016-04-03: qty 30

## 2016-04-03 MED ORDER — HYDROMORPHONE HCL 1 MG/ML IJ SOLN
1.0000 mg | Freq: Once | INTRAMUSCULAR | Status: AC
Start: 2016-04-03 — End: 2016-04-03
  Administered 2016-04-03: 1 mg via INTRAVENOUS
  Filled 2016-04-03: qty 1

## 2016-04-03 MED ORDER — IOPAMIDOL (ISOVUE-300) INJECTION 61%
100.0000 mL | Freq: Once | INTRAVENOUS | Status: AC | PRN
  Administered 2016-04-03: 100 mL via INTRAVENOUS

## 2016-04-03 MED ORDER — DICYCLOMINE HCL 20 MG PO TABS: 20.0000 mg | ORAL_TABLET | Freq: Two times a day (BID) | ORAL | 0 refills | Status: DC

## 2016-04-03 MED ORDER — DIPHENOXYLATE-ATROPINE 2.5-0.025 MG PO TABS: 1.0000 | ORAL_TABLET | Freq: Four times a day (QID) | ORAL | 0 refills | Status: DC | PRN

## 2016-04-03 NOTE — ED Notes (Signed)
E-signature pad not available at discharge.  Pt verbalized understanding of discharge instructions.

## 2016-04-03 NOTE — Progress Notes (Signed)
Pt noted to be calmer with female visitor at bedside.  Husband not present

## 2016-04-03 NOTE — ED Notes (Signed)
Patient transported to CT 

## 2016-04-03 NOTE — Discharge Instructions (Signed)
Please avoid use of NSAIDs. You may take the bentyl as prescribed for spasms. You may take the imodium as prescribed for diarrhea. You may take the zofran as needed for nausea. You may take the norco as needed for pain. Please return to the ED if he develops fevers, worsening left lower quadrant pain, uncontrolled vomiting, or for any other reason. Please follow-up with the GI doctor that I have given you a referral for. You may call for an appointment on Monday.

## 2016-04-03 NOTE — ED Provider Notes (Signed)
[facility] DEPT Provider Note   CSN: [id number] Arrival date & time: 04/03/16  1141     History   Chief Complaint Chief Complaint  Patient presents with  . Abdominal Pain  . Nausea    x 24 hours  . Diarrhea  . Back Pain    HPI Rebecca Lane is a 35 y.o. female.  The history is provided by the patient and medical records. No language interpreter was used.   Rebecca Lane is a 35 y.o. female  with a PMH of diverticulitis diagnosed last month who presents to the Emergency Department complaining of periumbilical and lower abdominal pain that began yesterday associated with nausea. Pain is worse when lying flat. She is taking no medications prior to arrival for her symptoms. She also endorses 9-10 episodes of loose stools yesterday and today. She was diagnosed with diverticulitis on 10/25 in the emergency department where she was discharged home with a course of antibiotics. She states that she successfully finished her antibiotic treatment and was feeling much better with no pain until yesterday. Today symptoms feel similar to last months of it, however with this episode she is also endorsing bilateral achy low back pain which was not present last month. She denies vomiting, fevers, chest pain, shortness of breath, vaginal discharge, dysuria.   Past Medical History:  Diagnosis Date  . Allergy   . Anemia   . Depression   . Diverticulitis   . Heart murmur   . Hx of ovarian cyst   . Hx of varicella     There are no active problems to display for this patient.   Past Surgical History:  Procedure Laterality Date  . NO PAST SURGERIES    . WISDOM TOOTH EXTRACTION      OB History    Gravida Para Term Preterm AB Living   2 2 2     2    SAB TAB Ectopic Multiple Live Births           2       Home Medications    Prior to Admission medications   Medication Sig Start Date End Date Taking? Authorizing Provider  acetaminophen (TYLENOL) 500 MG tablet Take 1,000 mg by  mouth every 6 (six) hours as needed (pain).   Yes Historical Provider, MD  Carbonyl Iron (FEOSOL PO) Take 1 tablet by mouth daily.    Yes Historical Provider, MD  ciprofloxacin (CIPRO) 500 MG tablet Take 1 tablet (500 mg total) by mouth 2 (two) times daily. Patient not taking: Reported on 04/03/2016 03/06/16   Sharlett Iles, MD  HYDROcodone-acetaminophen (NORCO/VICODIN) 5-325 MG tablet Take 1-2 tablets by mouth every 6 (six) hours as needed for severe pain. Patient not taking: Reported on 04/03/2016 03/06/16   Sharlett Iles, MD  metroNIDAZOLE (FLAGYL) 500 MG tablet Take 1 tablet (500 mg total) by mouth 2 (two) times daily. Patient not taking: Reported on 04/03/2016 03/06/16   Sharlett Iles, MD  ondansetron (ZOFRAN ODT) 4 MG disintegrating tablet Take 1 tablet (4 mg total) by mouth every 8 (eight) hours as needed for nausea or vomiting. Patient not taking: Reported on 04/03/2016 03/06/16   Sharlett Iles, MD    Family History Family History  Problem Relation Age of Onset  . Hypertension Mother   . Osteoporosis Mother   . Stroke Maternal Grandmother   . Hypertension Maternal Grandmother   . Diabetes Maternal Grandmother   . Thrombophlebitis Paternal Grandfather     Social History  Social History  Substance Use Topics  . Smoking status: Former Research scientist (life sciences)  . Smokeless tobacco: Never Used  . Alcohol use 0.6 oz/week    1 Standard drinks or equivalent per week     Allergies   Penicillins   Review of Systems Review of Systems  Constitutional: Negative for chills and fever.  HENT: Negative for congestion.   Eyes: Negative for visual disturbance.  Respiratory: Negative for cough and shortness of breath.   Cardiovascular: Negative.   Gastrointestinal: Positive for abdominal pain, diarrhea and nausea. Negative for vomiting.  Genitourinary: Negative for dysuria.  Musculoskeletal: Positive for back pain. Negative for neck pain.  Skin: Negative for rash.    Neurological: Negative for headaches.     Physical Exam Updated Vital Signs BP 129/67 (BP Location: Left Arm)   Pulse 74   Temp 98.7 F (37.1 C) (Oral)   Resp 18   LMP 03/13/2016 Comment: HCG ned 03/06/16  SpO2 99%   Physical Exam  Constitutional: She is oriented to person, place, and time. She appears well-developed and well-nourished. No distress.  HENT:  Head: Normocephalic and atraumatic.  Cardiovascular: Normal rate, regular rhythm and normal heart sounds.   No murmur heard. Pulmonary/Chest: Effort normal and breath sounds normal. No respiratory distress. She has no wheezes. She has no rales.  Abdominal: Soft. Bowel sounds are normal. She exhibits no distension. There is tenderness. There is guarding. There is no rebound.  Generalized tenderness to palpation, most significantly at periumbilical region.   Musculoskeletal: She exhibits no edema.  Neurological: She is alert and oriented to person, place, and time.  Skin: Skin is warm and dry.  Nursing note and vitals reviewed.    ED Treatments / Results  Labs (all labs ordered are listed, but only abnormal results are displayed) Labs Reviewed  COMPREHENSIVE METABOLIC PANEL - Abnormal; Notable for the following:       Result Value   CO2 21 (*)    Calcium 8.7 (*)    All other components within normal limits  URINALYSIS, ROUTINE W REFLEX MICROSCOPIC (NOT AT [facility]) - Abnormal; Notable for the following:    Hgb urine dipstick MODERATE (*)    Ketones, ur 15 (*)    All other components within normal limits  URINE MICROSCOPIC-ADD ON - Abnormal; Notable for the following:    Squamous Epithelial / LPF 0-5 (*)    Bacteria, UA FEW (*)    All other components within normal limits  LIPASE, BLOOD  CBC  POC URINE PREG, ED    EKG  EKG Interpretation None       Radiology No results found.  Procedures Procedures (including critical care time)  Medications Ordered in ED Medications  HYDROmorphone (DILAUDID) injection  1 mg (1 mg Intravenous Given 04/03/16 1430)  iopamidol (ISOVUE-300) 61 % injection 30 mL (30 mLs Oral Contrast Given 04/03/16 1513)     Initial Impression / Assessment and Plan / ED Course  I have reviewed the triage vital signs and the nursing notes.  Pertinent labs & imaging results that were available during my care of the patient were reviewed by me and considered in my medical decision making (see chart for details).  Clinical Course    Kirstina Bagdonas Paschen is a 35 y.o. female who presents to ED for abdominal pain that began yesterday. Of note, patient was recently treated for diverticulitis last month with Cipro/flagyl and chart reviewed from that visit. She had complete resolution of pain and then symptoms returned yesterday. On  exam, patient is afebrile and hemodynamically stable. She does have significant tenderness to the abdomen with guarding, most significantly periumbilically. Will obtain labs, ua, CT abd/pelvis. Will give pain meds then repeat abdominal exam. Patient declines nausea meds at this time.  Patient re-evaluated following pain meds. Feels better. Repeat abdominal exam unchanged.  Labs reviewed: normal white count. Lipase normal. CMP reassuring. UA with moderate amount of blood but no signs of infection.   CT abdomen/pelvis pending at shift change. Care assumed by oncoming provider PA Le[place]art. Case discussed, plan agreed upon. Will follow up CT abdomen/pelvis, repeat abdominal exam and dispo appropriately.    Final Clinical Impressions(s) / ED Diagnoses   Final diagnoses:  None    New Prescriptions New Prescriptions   No medications on file     [facility] Ward, PA-C 04/03/16 1608    Milton Ferguson, MD 04/04/16 [phone]

## 2016-04-03 NOTE — Progress Notes (Signed)
Pt noted without a pcp Pt was offered a list of [employer] in network providers by [facility] ED PM CM recently CM stopped by to see if pt had found a provider for f/u care Pt noted on stretcher to be twisting, turning and grimacing in pain holding her abdomen while a female visitor attempts to console her. Female visitor states the ED RN was trying to get an order for medication from EDP and pt husband would be visiting soon and may be able to answer if pt had a pcp.  A young female visitor noted in bedside chair.  Cm concern for pt falling off stretcher Siderail placed up Cm spoke with ED RN, Tim about pain medication for pt

## 2016-04-03 NOTE — ED Notes (Signed)
PA at bedside.

## 2016-04-04 NOTE — ED Provider Notes (Signed)
Patient was received in care hand off by PA Ward. Brief HPI of patient includes 35 y.o. female  with a PMH of diverticulitis diagnosed last month who presents to the Emergency Department complaining of periumbilical and lower abdominal pain that began yesterday associated with nausea. Pain is worse when lying flat. She is taking no medications prior to arrival for her symptoms. She also endorses 9-10 episodes of loose stools yesterday and today. She was diagnosed with diverticulitis on 10/25 in the emergency department where she was discharged home with a course of antibiotics. She states that she successfully finished her antibiotic treatment and was feeling much better with no pain until yesterday. She also endorses non bloody and looses stools which started yesterday with the pain. Today symptoms feel similar to last months. She denies vomiting, fevers, chest pain, shortness of breath, vaginal discharge, or urinary symptoms.  At time of care hand off CT results were pending. On review of CT results  1. No acute abnormality of the abdomen or pelvis.  2. Complete resolution of diverticulitis of the transverse colon.   When to reassess patient epigastric pain and she was crying in the bed and rolling around in pain. Patient states "it hurts so bad." She was given another dose of pain medicine then became nauseous and started dry heaving. Zofran was provided to the patient. She was given another dose of pain medicine and a gi cocktail. A pelvic was performed with a chaperon present that was unremarkable. She had no discharge present. No vaginal bleeding. She displayed no CMT or adnexal tenderness or fullness. Wet prep was unremarkable. All of her labs have been grossly unremarkable. Patient with few bacteria in UA but no nitrites or leukocytes. Denies any urinary symptoms. Will not treat. Patient feels much improved after gi cocktail and pain medicine. I have dicussed patient with [facility] physician on call  to get patient close follow up next week in the clinic. Patient endorses early feeling of saeity and bloating. Differential ist includes pud, h pylori infection, gastritis. It ws recommended by GI physician to avoid NSAIDS and take Prilosec 40mg  QD. He will see her in the office next week. She was given symptomatic treatment with Zofran, bentyl, imodium, and small dose of pain medicine. Patient encouraged to avoid NSAIDS. She was also encouraged to drink plenty of fluids. Strict return precautions were discussed. She has been able to tolerate po fluids and has not vomited and has not had an episode of diarrhea in the ED.  Pt is hemodynamically stable, in NAD, & able to ambulate in the ED. Pain has been managed & has no complaints prior to dc. Pt is comfortable with above plan and is stable for discharge at this time. All questions were answered prior to disposition. Strict return precautions for f/u to the ED were discussed. Patient was dicussed with Dr. Sherry Ruffing who agrees with the above plan.      Doristine Devoid, PA-C 04/04/16 [place], PA-C 04/04/16 [place], MD 04/05/16 1125

## 2016-04-08 ENCOUNTER — Encounter: Payer: Self-pay | Admitting: Physician Assistant

## 2016-04-08 ENCOUNTER — Telehealth: Payer: Self-pay | Admitting: Gastroenterology

## 2016-04-08 ENCOUNTER — Ambulatory Visit (INDEPENDENT_AMBULATORY_CARE_PROVIDER_SITE_OTHER): Payer: 59 | Admitting: Physician Assistant

## 2016-04-08 VITALS — BP 116/80 | HR 84 | Ht 67.5 in | Wt 183.2 lb

## 2016-04-08 DIAGNOSIS — R1084 Generalized abdominal pain: Secondary | ICD-10-CM

## 2016-04-08 DIAGNOSIS — K5732 Diverticulitis of large intestine without perforation or abscess without bleeding: Secondary | ICD-10-CM | POA: Diagnosis not present

## 2016-04-08 NOTE — Progress Notes (Signed)
Subjective:    Patient ID: Rebecca Lane, female    DOB: [birth year], 35 y.o.   MRN: [id number]  HPI Rebecca Lane is a pleasant 35 year old African-American female, new to GI today referred by [facility]/Christopher Tegelar  M.D. after recent visit. Patient initially was seen in the emergency room on 03/06/2016 with acute mid. Umbilical pain. She underwent CT of the abdomen and pelvis which showed pan diverticulosis and acute diverticulitis involving the proximal to mid transverse colon. She was treated with a course of Cipro and Flagyl says that her symptoms resolved. On 04/02/2016 she developed recurrent abdominal pain in the same location and says this was more of a squeezing cramping pain. Also within that previous 24 hours she had had several loose bowel movements, complained of feeling nauseated ,was unable to eat and felt generally bloated and uncomfortable.  Labs were done with normal CBC and hepatic panel, and CT of the abdomen and pelvis was repeated. There was complete resolution of the diverticulitis, and CT was otherwise negative with the exception of a partially collapsed 2 cm right ovarian cyst.  Over the past 4-5 days she says she has been afraid to eat as she was not feeling as if her food was digesting. She has had 2 normal bowel movements over the weekend admits to feeling less bloated and uncomfortable. She has been taking omeprazole 20 mg by mouth every morning and also on Bentyl 10 mg by mouth twice a day. Again since her ER visit on the 22nd no nausea, vomiting, fever, chills or further diarrhea.  Review of Systems Pertinent positive and negative review of systems were noted in the above HPI section.  All other review of systems was otherwise negative.  Outpatient Encounter Prescriptions as of 04/08/2016  Medication Sig  . Carbonyl Iron (FEOSOL PO) Take 1 tablet by mouth daily.   Marland Kitchen acetaminophen (TYLENOL) 500 MG tablet Take 1,000 mg by mouth every 6 (six) hours as  needed (pain).  Marland Kitchen dicyclomine (BENTYL) 20 MG tablet Take 1 tablet (20 mg total) by mouth 2 (two) times daily. (Patient not taking: Reported on 04/08/2016)  . HYDROcodone-acetaminophen (NORCO/VICODIN) 5-325 MG tablet Take 1 tablet by mouth every 6 (six) hours as needed. (Patient not taking: Reported on 04/08/2016)  . loperamide (IMODIUM) 2 MG capsule Take 1 capsule (2 mg total) by mouth 4 (four) times daily as needed for diarrhea or loose stools. 4 mg ORALLY followed by 2 mg after each loose stool up to a maximum of 16 mg/day (Patient not taking: Reported on 04/08/2016)  . omeprazole (PRILOSEC) 20 MG capsule Take 2 capsules (40 mg total) by mouth daily. (Patient not taking: Reported on 04/08/2016)  . ondansetron (ZOFRAN) 4 MG tablet Take 1 tablet (4 mg total) by mouth every 6 (six) hours. (Patient not taking: Reported on 04/08/2016)   No facility-administered encounter medications on file as of 04/08/2016.    Allergies  Allergen Reactions  . Penicillins Other (See Comments)    Reaction:  Unknown--childhood reaction  Has patient had a PCN reaction causing immediate rash, facial/tongue/throat swelling, SOB or lightheadedness with hypotension: unknown Has patient had a PCN reaction causing severe rash involving mucus membranes or skin necrosis: unknown Has patient had a PCN reaction that required hospitalization : unknown Has patient had a PCN reaction occurring within the last 10 years: no If all answers are "NO", then may proceed with Cephalosporin use.    There are no active problems to display for this patient.  Social History   Social History  . Marital status: Married    Spouse name: N/A  . Number of children: 2  . Years of education: N/A   Occupational History  . [employer]    Social History Main Topics  . Smoking status: Former Smoker    Quit date: 04/08/2008  . Smokeless tobacco: Never Used  . Alcohol use 0.6 oz/week    1 Standard drinks or equivalent per week      Comment: occational  . Drug use: No  . Sexual activity: Yes    Birth control/ protection: None   Other Topics Concern  . Not on file   Social History Narrative  . No narrative on file    Ms. Belsky's family history includes Diabetes in her maternal grandmother and other; Hypertension in her maternal grandmother and mother; Osteoporosis in her mother; Stroke in her maternal grandmother; Thrombophlebitis in her paternal grandfather.      Objective:    Vitals:   04/08/16 1316  BP: 116/80  Pulse: 84    Physical Exam  well-developed young African-American female in no acute distress, pleasant blood pressure 116/80 pulse 84, height 5 foot 7 weight 183 BMI 28.2. HEENT; nontraumatic normocephalic EOMI PERRLA sclera anicteric, Cardiovascular ;regular rate and rhythm with S1-S2 no murmur or gallop, Pulmonary; clear bilaterally, Abdomen ;soft for some minimal epigastric tenderness no guarding or rebound no palpable mass or hepatosplenomegaly bowel sounds are present, Rectal; exam not done, Ext;no clubbing cyanosis or edema skin warm and dry, Neuropsych; mood and affect appropriate       Assessment & Plan:   #35 35 year old female [address] documented pan diverticulosis and an acute episode of diverticulitis involving the transverse colon October 2017. Symptoms resolved with a course of Cipro and Flagyl. Last week she had onset of similar mid abdominal pain cramping nausea and had diarrhea for 1 day. ER evaluation with unremarkable labs and repeat CT of the abdomen and pelvis showing complete resolution of the diverticulitis. Etiology of most recent constellation of symptoms is not clear, she may have had an acute short-lived gastroenteritis. Negative CT and normal labs very reassuring.  Plan; reassurance, patient is advised to gradually advance her diet to her normal high-fiber diet but start with bland foods. She will continue Bentyl 10 mg by mouth twice a day 2 weeks line Continue  omeprazole 20 mg by mouth every morning 2 weeks She will follow-up with Dr. Fuller Plan or myself on an as-needed basis and is asked to call should she have any recurrence of symptoms. General  management of diverticulosis was discussed including high-fiber diet, and avoidance of popcorn and nuts. We also discussed Colonoscopy. She would like to think about colonoscopy and will call back if she decides to schedule.   Amy S Esterwood PA-C 04/08/2016   Cc: No ref. provider found

## 2016-04-08 NOTE — Patient Instructions (Signed)
Continue the Bentyl 10 mg, take 2-3 times daily for 2 weeks then as needed for spasms.  Continue Omeprazole 20 mg= take 2 daily for 2 weeks.   Gradually advance to a high fiber diet.

## 2016-04-08 NOTE — Telephone Encounter (Signed)
Patient was in the

## 2016-04-08 NOTE — Progress Notes (Signed)
Reviewed and agree with initial management plan.  Santiago Graf T. Chauntel Windsor, MD FACG 

## 2016-04-08 NOTE — Telephone Encounter (Signed)
Patient is scheduled to come in today for re-evaluation of her abdominal pain.

## 2016-11-12 ENCOUNTER — Telehealth: Payer: Self-pay | Admitting: Gastroenterology

## 2016-11-12 NOTE — Telephone Encounter (Signed)
Patient c/o constipation. She is advised to take Miralax 1-2 times a day in 8 oz of fluid daily.  She will call back in 1-2 weeks with an update and will discuss if appt needs to be moved up.

## 2016-11-12 NOTE — Telephone Encounter (Signed)
No will need to have an OV with Dr. Fuller Plan for follow up to determine if colonoscopy is needed.

## 2016-11-12 NOTE — Telephone Encounter (Signed)
OV scheduled with Dr. Fuller Plan on 12/30/16. Patient states that she is still having problems digesting food and is wanting to know what she can do in the meantime. Best # [phone]

## 2016-12-30 ENCOUNTER — Encounter: Payer: Self-pay | Admitting: Gastroenterology

## 2016-12-30 ENCOUNTER — Encounter (INDEPENDENT_AMBULATORY_CARE_PROVIDER_SITE_OTHER): Payer: Self-pay

## 2016-12-30 ENCOUNTER — Ambulatory Visit (INDEPENDENT_AMBULATORY_CARE_PROVIDER_SITE_OTHER): Payer: 59 | Admitting: Gastroenterology

## 2016-12-30 VITALS — BP 118/74 | HR 72 | Ht 68.0 in | Wt 182.4 lb

## 2016-12-30 DIAGNOSIS — K5900 Constipation, unspecified: Secondary | ICD-10-CM | POA: Diagnosis not present

## 2016-12-30 DIAGNOSIS — R1033 Periumbilical pain: Secondary | ICD-10-CM

## 2016-12-30 MED ORDER — SUPREP BOWEL PREP KIT 17.5-3.13-1.6 GM/177ML PO SOLN: ORAL | 0 refills | Status: DC

## 2016-12-30 NOTE — Patient Instructions (Signed)
You have been scheduled for a colonoscopy. Please follow written instructions given to you at your visit today.  Please pick up your prep supplies at the pharmacy within the next 1-3 days. If you use inhalers (even only as needed), please bring them with you on the day of your procedure. Your physician has requested that you go to www.startemmi.com and enter the access code given to you at your visit today. This web site gives a general overview about your procedure. However, you should still follow specific instructions given to you by our office regarding your preparation for the procedure.  Thank you for choosing me and [facility].  Malcolm T. Stark, Jr., MD., FACG  

## 2016-12-30 NOTE — Progress Notes (Signed)
    History of Present Illness: This is a 36 year old female with constipation and mid abdominal pain. Since her episode of diverticulitis last year she has had ongoing difficulties with constipation. For adequate bowel movements she needs to stay on a stool softener every day as well as a high fiber diet. When she discontinues any of these practices her constipation returns. She has brief episodes of mild periumbilical pain that are very transient and last only a few seconds which is not correlated with bowel movements, meals, activities or time of day. Denies weight loss, diarrhea, change in stool caliber, melena, hematochezia, nausea, vomiting, dysphagia, reflux symptoms, chest pain.  Current Medications, Allergies, Past Medical History, Past Surgical History, Family History and Social History were reviewed in [employer] record.  Physical Exam: General: Well developed, well nourished, no acute distress Head: Normocephalic and atraumatic Eyes:  sclerae anicteric, EOMI Ears: Normal auditory acuity Mouth: No deformity or lesions Lungs: Clear throughout to auscultation Heart: Regular rate and rhythm; no murmurs, rubs or bruits Abdomen: Soft, non tender and non distended. No masses, hepatosplenomegaly or hernias noted. Normal Bowel sounds Rectal: deferred to colonoscopy Musculoskeletal: Symmetrical with no gross deformities  Pulses:  Normal pulses noted Extremities: No clubbing, cyanosis, edema or deformities noted Neurological: Alert oriented x 4, grossly nonfocal Psychological:  Alert and cooperative. Normal mood and affect  Assessment and Recommendations:  1. Chronic constipation. History of diverticulitis. Maintain a high-fiber diet with adequate daily water intake. Maintain a stool softener daily. Schedule colonoscopy for further evaluation. The risks (including bleeding, perforation, infection, missed lesions, medication reactions and possible hospitalization or  surgery if complications occur), benefits, and alternatives to colonoscopy with possible biopsy and possible polypectomy were discussed with the patient and they consent to proceed.   2. Breif periumbilical abdominal pain lasting only a few seconds at a time which is not correlated with any digestive function. Possible muscle spasms. Observe for now and patient advised to report any change in symptoms.

## 2017-02-13 ENCOUNTER — Encounter: Payer: Self-pay | Admitting: Gastroenterology

## 2017-02-26 ENCOUNTER — Ambulatory Visit (AMBULATORY_SURGERY_CENTER): Payer: 59 | Admitting: Gastroenterology

## 2017-02-26 ENCOUNTER — Encounter: Payer: Self-pay | Admitting: Gastroenterology

## 2017-02-26 VITALS — BP 98/63 | HR 80 | Temp 98.0°F | Resp 12 | Ht 68.0 in | Wt 182.0 lb

## 2017-02-26 DIAGNOSIS — K635 Polyp of colon: Secondary | ICD-10-CM | POA: Diagnosis not present

## 2017-02-26 DIAGNOSIS — D125 Benign neoplasm of sigmoid colon: Secondary | ICD-10-CM | POA: Diagnosis not present

## 2017-02-26 DIAGNOSIS — R933 Abnormal findings on diagnostic imaging of other parts of digestive tract: Secondary | ICD-10-CM

## 2017-02-26 DIAGNOSIS — K5900 Constipation, unspecified: Secondary | ICD-10-CM

## 2017-02-26 MED ORDER — SODIUM CHLORIDE 0.9 % IV SOLN
500.0000 mL | INTRAVENOUS | Status: DC
Start: 2017-02-26 — End: 2017-02-26

## 2017-02-26 NOTE — Progress Notes (Signed)
Called to room to assist during endoscopic procedure.  Patient ID and intended procedure confirmed with present staff. Received instructions for my participation in the procedure from the performing physician.  

## 2017-02-26 NOTE — Progress Notes (Signed)
Report to PACU, RN, vss, BBS= Clear.  

## 2017-02-26 NOTE — Patient Instructions (Signed)
Colon polyp x 1 removed today. Handouts given: polyps,diverticulosis, hemorrhoids, and high fiber diet. Result letter in your mail in 2-3 weeks. Call us with any questions or concerns. Thank you!  YOU HAD AN ENDOSCOPIC PROCEDURE TODAY AT [facility]:   Refer to the procedure report that was given to you for any specific questions about what was found during the examination.  If the procedure report does not answer your questions, please call your gastroenterologist to clarify.  If you requested that your care partner not be given the details of your procedure findings, then the procedure report has been included in a sealed envelope for you to review at your convenience later.  YOU SHOULD EXPECT: Some feelings of bloating in the abdomen. Passage of more gas than usual.  Walking can help get rid of the air that was put into your GI tract during the procedure and reduce the bloating. If you had a lower endoscopy (such as a colonoscopy or flexible sigmoidoscopy) you may notice spotting of blood in your stool or on the toilet paper. If you underwent a bowel prep for your procedure, you may not have a normal bowel movement for a few days.  Please Note:  You might notice some irritation and congestion in your nose or some drainage.  This is from the oxygen used during your procedure.  There is no need for concern and it should clear up in a day or so.  SYMPTOMS TO REPORT IMMEDIATELY:   Following lower endoscopy (colonoscopy or flexible sigmoidoscopy):  Excessive amounts of blood in the stool  Significant tenderness or worsening of abdominal pains  Swelling of the abdomen that is new, acute  Fever of 100F or higher  For urgent or emergent issues, a gastroenterologist can be reached at any hour by calling [phone].   DIET:  We do recommend a small meal at first, but then you may proceed to your regular diet.  Drink plenty of fluids but you should avoid alcoholic beverages for 24  hours.  ACTIVITY:  You should plan to take it easy for the rest of today and you should NOT DRIVE or use heavy machinery until tomorrow (because of the sedation medicines used during the test).    FOLLOW UP: Our staff will call the number listed on your records the next business day following your procedure to check on you and address any questions or concerns that you may have regarding the information given to you following your procedure. If we do not reach you, we will leave a message.  However, if you are feeling well and you are not experiencing any problems, there is no need to return our call.  We will assume that you have returned to your regular daily activities without incident.  If any biopsies were taken you will be contacted by phone or by letter within the next 1-3 weeks.  Please call us at [phone] if you have not heard about the biopsies in 3 weeks.    SIGNATURES/CONFIDENTIALITY: You and/or your care partner have signed paperwork which will be entered into your electronic medical record.  These signatures attest to the fact that that the information above on your After Visit Summary has been reviewed and is understood.  Full responsibility of the confidentiality of this discharge information lies with you and/or your care-partner.

## 2017-02-26 NOTE — Op Note (Signed)
[facility] Patient Name: Rebecca Lane Procedure Date: 02/26/2017 1:35 PM MRN: [id number] Endoscopist: Ladene Artist , MD Age: 36 Referring MD:  Date of Birth: 06-22-1980 Gender: Female Account #: [id number] Procedure:                Colonoscopy Indications:              Abnormal CT of the GI tract, Constipation Medicines:                Monitored Anesthesia Care Procedure:                Pre-Anesthesia Assessment:                           - Prior to the procedure, a History and Physical                            was performed, and patient medications and                            allergies were reviewed. The patient's tolerance of                            previous anesthesia was also reviewed. The risks                            and benefits of the procedure and the sedation                            options and risks were discussed with the patient.                            All questions were answered, and informed consent                            was obtained. Prior Anticoagulants: The patient has                            taken no previous anticoagulant or antiplatelet                            agents. ASA Grade Assessment: II - A patient with                            mild systemic disease. After reviewing the risks                            and benefits, the patient was deemed in                            satisfactory condition to undergo the procedure.                           After obtaining informed consent, the colonoscope  was passed under direct vision. Throughout the                            procedure, the patient's blood pressure, pulse, and                            oxygen saturations were monitored continuously. The                            Model PCF-[id number]) scope was introduced                            through the anus and advanced to the the cecum,                            identified by  appendiceal orifice and ileocecal                            valve. The ileocecal valve, appendiceal orifice,                            and rectum were photographed. The quality of the                            bowel preparation was excellent. The colonoscopy                            was performed without difficulty. The patient                            tolerated the procedure well. Scope In: 1:42:49 PM Scope Out: 1:56:10 PM Scope Withdrawal Time: 0 hours 11 minutes 20 seconds  Total Procedure Duration: 0 hours 13 minutes 21 seconds  Findings:                 The perianal and digital rectal examinations were                            normal.                           A 5 mm polyp was found in the sigmoid colon. The                            polyp was sessile. The polyp was removed with a                            cold biopsy forceps. Resection and retrieval were                            complete.                           Many medium-mouthed diverticula were found in the  left colon. There was no evidence of diverticular                            bleeding.                           Internal hemorrhoids were found during                            retroflexion. The hemorrhoids were small and Grade                            I (internal hemorrhoids that do not prolapse).                           The exam was otherwise without abnormality on                            direct and retroflexion views. Complications:            No immediate complications. Estimated blood loss:                            None. Estimated Blood Loss:     Estimated blood loss: none. Impression:               - One 5 mm polyp in the sigmoid colon, removed with                            a cold biopsy forceps. Resected and retrieved.                           - Mild diverticulosis in the left colon. There was                            no evidence of diverticular bleeding.                            - Internal hemorrhoids.                           - The examination was otherwise normal on direct                            and retroflexion views. Recommendation:           - Repeat colonoscopy in 5 years for surveillance if                            polyp is precancerous, otherwise age 27 for                            screening.                           - Patient has a contact number available for  emergencies. The signs and symptoms of potential                            delayed complications were discussed with the                            patient. Return to normal activities tomorrow.                            Written discharge instructions were provided to the                            patient.                           - Continue present medications.                           - Await pathology results.                           - High fiber diet indefinitely. Ladene Artist, MD 02/26/2017 2:00:06 PM This report has been signed electronically.

## 2017-02-27 ENCOUNTER — Telehealth: Payer: Self-pay | Admitting: *Deleted

## 2017-02-27 NOTE — Telephone Encounter (Signed)
No answer, message left for the patient. 

## 2017-02-27 NOTE — Telephone Encounter (Signed)
  Follow up Call-  Call back number 02/26/2017  Post procedure Call Back phone  # [phone]  Permission to leave phone message Yes  Some recent data might be hidden     Patient questions:  Do you have a fever, pain , or abdominal swelling? No. Pain Score  0 *  Have you tolerated food without any problems? Yes.    Have you been able to return to your normal activities? Yes.    Do you have any questions about your discharge instructions: Diet   No. Medications  No. Follow up visit  No.  Do you have questions or concerns about your Care? No.  Actions: * If pain score is 4 or above: No action needed, pain <4.

## 2017-03-07 ENCOUNTER — Encounter: Payer: Self-pay | Admitting: Gastroenterology

## 2018-06-20 ENCOUNTER — Emergency Department (HOSPITAL_COMMUNITY): Payer: BLUE CROSS/BLUE SHIELD

## 2018-06-20 ENCOUNTER — Other Ambulatory Visit: Payer: Self-pay

## 2018-06-20 ENCOUNTER — Encounter (HOSPITAL_COMMUNITY): Payer: Self-pay

## 2018-06-20 ENCOUNTER — Emergency Department (HOSPITAL_COMMUNITY)
Admission: EM | Admit: 2018-06-20 | Discharge: 2018-06-20 | Disposition: A | Discharge status: Home / Self Care | Payer: BLUE CROSS/BLUE SHIELD | Attending: Emergency Medicine | Admitting: Emergency Medicine

## 2018-06-20 DIAGNOSIS — R1084 Generalized abdominal pain: Secondary | ICD-10-CM | POA: Diagnosis not present

## 2018-06-20 DIAGNOSIS — R1030 Lower abdominal pain, unspecified: Secondary | ICD-10-CM | POA: Insufficient documentation

## 2018-06-20 DIAGNOSIS — Z79899 Other long term (current) drug therapy: Secondary | ICD-10-CM | POA: Diagnosis not present

## 2018-06-20 DIAGNOSIS — N83201 Unspecified ovarian cyst, right side: Secondary | ICD-10-CM | POA: Insufficient documentation

## 2018-06-20 DIAGNOSIS — N83291 Other ovarian cyst, right side: Secondary | ICD-10-CM | POA: Diagnosis not present

## 2018-06-20 DIAGNOSIS — N939 Abnormal uterine and vaginal bleeding, unspecified: Secondary | ICD-10-CM | POA: Diagnosis not present

## 2018-06-20 DIAGNOSIS — Z87891 Personal history of nicotine dependence: Secondary | ICD-10-CM | POA: Diagnosis not present

## 2018-06-20 HISTORY — DX: Polyp of corpus uteri: N84.0

## 2018-06-20 LAB — COMPREHENSIVE METABOLIC PANEL
ALT: 10 U/L (ref 0–44)
AST: 12 U/L — ABNORMAL LOW (ref 15–41)
Albumin: 4 g/dL (ref 3.5–5.0)
Alkaline Phosphatase: 27 U/L — ABNORMAL LOW (ref 38–126)
Anion gap: 5 (ref 5–15)
BUN: 10 mg/dL (ref 6–20)
CHLORIDE: 107 mmol/L (ref 98–111)
CO2: 24 mmol/L (ref 22–32)
CREATININE: 0.69 mg/dL (ref 0.44–1.00)
Calcium: 8.5 mg/dL — ABNORMAL LOW (ref 8.9–10.3)
Glucose, Bld: 101 mg/dL — ABNORMAL HIGH (ref 70–99)
Potassium: 3.6 mmol/L (ref 3.5–5.1)
SODIUM: 136 mmol/L (ref 135–145)
Total Bilirubin: 0.5 mg/dL (ref 0.3–1.2)
Total Protein: 7.5 g/dL (ref 6.5–8.1)

## 2018-06-20 LAB — CBC
HEMATOCRIT: 33.9 % — AB (ref 36.0–46.0)
Hemoglobin: 10.5 g/dL — ABNORMAL LOW (ref 12.0–15.0)
MCH: 29.4 pg (ref 26.0–34.0)
MCHC: 31 g/dL (ref 30.0–36.0)
MCV: 95 fL (ref 80.0–100.0)
NRBC: 0 % (ref 0.0–0.2)
Platelets: 201 10*3/uL (ref 150–400)
RBC: 3.57 MIL/uL — AB (ref 3.87–5.11)
RDW: 12.7 % (ref 11.5–15.5)
WBC: 5.6 10*3/uL (ref 4.0–10.5)

## 2018-06-20 LAB — URINALYSIS, ROUTINE W REFLEX MICROSCOPIC
BACTERIA UA: NONE SEEN
Bilirubin Urine: NEGATIVE
Glucose, UA: NEGATIVE mg/dL
KETONES UR: 5 mg/dL — AB
Leukocytes, UA: NEGATIVE
NITRITE: NEGATIVE
Protein, ur: NEGATIVE mg/dL
SPECIFIC GRAVITY, URINE: 1.03 (ref 1.005–1.030)
pH: 5 (ref 5.0–8.0)

## 2018-06-20 LAB — LIPASE, BLOOD: LIPASE: 42 U/L (ref 11–51)

## 2018-06-20 LAB — I-STAT BETA HCG BLOOD, ED (MC, WL, AP ONLY)

## 2018-06-20 MED ORDER — OXYCODONE HCL 5 MG PO TABS: 5.0000 mg | ORAL_TABLET | ORAL | 0 refills | Status: DC | PRN

## 2018-06-20 MED ORDER — KETOROLAC TROMETHAMINE 30 MG/ML IJ SOLN
30.0000 mg | Freq: Once | INTRAMUSCULAR | Status: AC
  Administered 2018-06-20: 30 mg via INTRAVENOUS
  Filled 2018-06-20: qty 1

## 2018-06-20 MED ORDER — MORPHINE SULFATE (PF) 4 MG/ML IV SOLN
4.0000 mg | Freq: Once | INTRAVENOUS | Status: AC
  Administered 2018-06-20: 4 mg via INTRAVENOUS
  Filled 2018-06-20: qty 1

## 2018-06-20 MED ORDER — SODIUM CHLORIDE 0.9% FLUSH
3.0000 mL | Freq: Once | INTRAVENOUS | Status: AC
  Administered 2018-06-20: 3 mL via INTRAVENOUS

## 2018-06-20 NOTE — ED Triage Notes (Signed)
Pt with abdominal pain x 3 days.  Worsening today.  Vaginal bleeding.  Pt had d&c 1 month ago.  Found polyp in uterus.  Bleeding has worsened.  No fever.

## 2018-06-20 NOTE — ED Notes (Signed)
ED Provider at bedside. 

## 2018-06-20 NOTE — Discharge Instructions (Signed)
It was my pleasure taking care of you today!   It is very important that you call your OB/GYN first thing Monday morning to schedule a follow-up appointment.  Return to the emergency department for new or worsening symptoms, any additional concerns.

## 2018-06-20 NOTE — ED Provider Notes (Signed)
[facility] DEPT Provider Note   CSN: [id number] Arrival date & time: 06/20/18  1331     History   Chief Complaint Chief Complaint  Patient presents with  . Abdominal Pain    HPI Rebecca Lane is a 38 y.o. female.  The history is provided by the patient and medical records. No language interpreter was used.  Abdominal Pain  Associated symptoms: vaginal bleeding   Associated symptoms: no constipation, no diarrhea, no dysuria, no nausea, no vaginal discharge and no vomiting    Rebecca Lane is a 38 y.o. female who presents to the Emergency Department complaining of vaginal bleeding and abdominal pain.  Patient reports that 1 month ago, she had a D&C with uterine polyp removal.  She is followed by Dr. Corinna Capra of OB/GYN.  She felt as if she was healing routinely, then 3 days ago she started having similar symptoms of vaginal bleeding and lower abdominal pain.  She was taking Tylenol and her prescription strength ibuprofen which is helping with the pain.  Last night, her symptoms were actually nearly resolved.  She had very minimal vaginal bleeding, mostly just spotting, and no pain.  This morning around noon, she began bleeding again and having severe lower abdominal pain.  She reports that she went through 2 pads and 2 tampons within an hour.  Since then, her bleeding has stabilized.  She has only gone through 1 tampon in the last several hours.  She denies any dysuria or other urinary symptoms.  No fever or chills.  No nausea or vomiting.  No diarrhea, constipation or blood in stool.  Past Medical History:  Diagnosis Date  . Allergy   . Anemia   . Anxiety   . Depression   . Diverticulitis   . Heart murmur   . Hx of ovarian cyst   . Hx of varicella   . Preeclampsia   . Uterine polyp     There are no active problems to display for this patient.   Past Surgical History:  Procedure Laterality Date  . DILATION AND CURETTAGE OF UTERUS    . WISDOM  TOOTH EXTRACTION       OB History    Gravida  2   Para  2   Term  2   Preterm      AB      Living  2     SAB      TAB      Ectopic      Multiple      Live Births  2            Home Medications    Prior to Admission medications   Medication Sig Start Date End Date Taking? Authorizing Provider  acetaminophen (TYLENOL) 500 MG tablet Take 1,000 mg by mouth every 6 (six) hours as needed (pain).    [provider]  Carbonyl Iron (FEOSOL PO) Take 1 tablet by mouth daily.     [provider]  Docusate Calcium (STOOL SOFTENER PO) Take by mouth daily as needed.    [provider]    Family History Family History  Problem Relation Age of Onset  . Hypertension Mother   . Osteoporosis Mother   . Stroke Maternal Grandmother   . Hypertension Maternal Grandmother   . Diabetes Maternal Grandmother   . Thrombophlebitis Paternal Grandfather   . Diabetes Other        niece    Social History Social History  Tobacco Use  . Smoking status: Former Smoker    Last attempt to quit: 04/08/2008    Years since quitting: 10.2  . Smokeless tobacco: Never Used  Substance Use Topics  . Alcohol use: Yes    Alcohol/week: 1.0 standard drinks    Types: 1 Standard drinks or equivalent per week    Comment: occational  . Drug use: No     Allergies   Penicillins   Review of Systems Review of Systems  Gastrointestinal: Positive for abdominal pain. Negative for blood in stool, constipation, diarrhea, nausea and vomiting.  Genitourinary: Positive for vaginal bleeding. Negative for difficulty urinating, dysuria, frequency, urgency and vaginal discharge.  All other systems reviewed and are negative.    Physical Exam Updated Vital Signs BP 123/85 (BP Location: Right Arm)   Pulse 63   Temp 98.4 F (36.9 C) (Oral)   Resp 14   Ht 5\' 8"  (1.727 m)   Wt 73 kg   SpO2 99%   BMI 24.48 kg/m   Physical Exam Vitals signs and nursing note reviewed.    Constitutional:      General: She is not in acute distress.    Appearance: She is well-developed.  HENT:     Head: Normocephalic and atraumatic.  Neck:     Musculoskeletal: Neck supple.  Cardiovascular:     Rate and Rhythm: Normal rate and regular rhythm.     Heart sounds: Normal heart sounds. No murmur.  Pulmonary:     Effort: Pulmonary effort is normal. No respiratory distress.     Breath sounds: Normal breath sounds.  Abdominal:     General: There is no distension.     Palpations: Abdomen is soft.     Comments: Diffuse lower abdominal tenderness without rebound or guarding.  No focal abdominal tenderness, specifically none at McBurney's point.  Genitourinary:    Comments: Chaperone present for exam.  Minimal vaginal bleeding. + Right adnexal tenderness.  No cervical motion or left-sided tenderness. Skin:    General: Skin is warm and dry.  Neurological:     Mental Status: She is alert and oriented to person, place, and time.      ED Treatments / Results  Labs (all labs ordered are listed, but only abnormal results are displayed) Labs Reviewed  COMPREHENSIVE METABOLIC PANEL - Abnormal; Notable for the following components:      Result Value   Glucose, Bld 101 (*)    Calcium 8.5 (*)    AST 12 (*)    Alkaline Phosphatase 27 (*)    All other components within normal limits  CBC - Abnormal; Notable for the following components:   RBC 3.57 (*)    Hemoglobin 10.5 (*)    HCT 33.9 (*)    All other components within normal limits  URINALYSIS, ROUTINE W REFLEX MICROSCOPIC - Abnormal; Notable for the following components:   Color, Urine AMBER (*)    Hgb urine dipstick MODERATE (*)    Ketones, ur 5 (*)    All other components within normal limits  LIPASE, BLOOD  I-STAT BETA HCG BLOOD, ED ([facility], [facility], [facility] ONLY)    EKG None  Radiology US Pelvis Complete  Result Date: 06/20/2018 CLINICAL DATA:  38 year old female presents with pelvic pain. LMP 05/27/2018. EXAM: TRANSABDOMINAL  AND TRANSVAGINAL ULTRASOUND OF PELVIS DOPPLER ULTRASOUND OF OVARIES TECHNIQUE: Both transabdominal and transvaginal ultrasound examinations of the pelvis were performed. Transabdominal technique was performed for global imaging of the pelvis including uterus, ovaries, adnexal regions, and  pelvic cul-de-sac. It was necessary to proceed with endovaginal exam following the transabdominal exam to visualize the endometrium and adnexa. Color and duplex Doppler ultrasound was utilized to evaluate blood flow to the ovaries. COMPARISON:  04/03/2016 CT abdomen/pelvis. FINDINGS: Uterus Measurements: 9.7 x 5.0 x 6.3 cm = volume: 160 mL. Anteverted uterus is mildly enlarged and otherwise normal in configuration, with no uterine fibroids or other myometrial abnormalities. Endometrium Thickness: 13 mm. Mildly heterogeneous endometrium with no endometrial cavity fluid or focal endometrial mass. Right ovary Measurements: 2.5 x 1.6 x 2.4 cm = volume: 5.0 mL. Small 1.5 x 1.0 x 1.3 cm hemorrhagic right ovarian cyst. No abnormal right ovarian or right adnexal masses. Left ovary Measurements: 3.6 x 2.0 x 2.4 cm = volume: 8.9 mL. Simple 1.5 x 1.1 x 1.6 cm left ovarian follicular cyst. No abnormal left ovarian or left adnexal masses. Pulsed Doppler evaluation of both ovaries demonstrates normal low-resistance arterial and venous waveforms. Other findings No abnormal free fluid. IMPRESSION: 1. No evidence of adnexal torsion. 2. No suspicious ovarian or adnexal findings. Small 1.5 cm hemorrhagic right ovarian cyst. 3. Unremarkable anteverted uterus. Electronically Signed   By: Ilona Sorrel M.D.   On: 06/20/2018 21:36    Procedures Procedures (including critical care time)  Medications Ordered in ED Medications  sodium chloride flush (NS) 0.9 % injection 3 mL (3 mLs Intravenous Given 06/20/18 2101)  morphine 4 MG/ML injection 4 mg (4 mg Intravenous Given 06/20/18 2101)  ketorolac (TORADOL) 30 MG/ML injection 30 mg (30 mg Intravenous  Given 06/20/18 2233)     Initial Impression / Assessment and Plan / ED Course  I have reviewed the triage vital signs and the nursing notes.  Pertinent labs & imaging results that were available during my care of the patient were reviewed by me and considered in my medical decision making (see chart for details).    Rebecca Lane is a 38 y.o. female who presents to ED for vaginal bleeding and lower abdominal pain x 3 days.  Last night, her symptoms had actually resolved, but returned again around noon today.  On exam, she is afebrile, hemodynamically stable with diffuse tenderness across the lower abdomen.  She does have adnexal tenderness to the right side as well.  She actually has very minimal bleeding in the vaginal vault and does not appear to have any active bleeding from the cervix.  Labs reviewed and reassuring.  Hemoglobin of 10.5 which she states is normal for her.  Ultrasound reviewed showing no evidence of torsion or ovarian/adnexal findings.  Unremarkable uterus.  Does have a small 1.5 cm hemorrhagic right ovarian cyst.  Discussed results with patient. Evaluation does not show pathology that would require ongoing emergent intervention or inpatient treatment. Strongly encouraged her to call her OB/GYN on Monday morning to schedule follow-up appointment.  Home care instructions and reasons to return to the emergency department were discussed and all questions were answered.  Patient discussed with Dr. Ralene Bathe who agrees with treatment plan.    Final Clinical Impressions(s) / ED Diagnoses   Final diagnoses:  Vaginal bleeding    ED Discharge Orders    None       Ellinore Merced, Ozella Almond, PA-C 06/20/18 2246    Quintella Reichert, MD 06/21/18 0025

## 2018-06-20 NOTE — ED Notes (Signed)
Pt given urine cup and told that we would like a urine specimen.

## 2018-06-20 NOTE — ED Notes (Signed)
Ultrasound at bedside at this time.

## 2018-06-22 DIAGNOSIS — H6981 Other specified disorders of Eustachian tube, right ear: Secondary | ICD-10-CM | POA: Diagnosis not present

## 2018-06-22 DIAGNOSIS — H6123 Impacted cerumen, bilateral: Secondary | ICD-10-CM | POA: Diagnosis not present

## 2019-03-18 DIAGNOSIS — R102 Pelvic and perineal pain: Secondary | ICD-10-CM | POA: Diagnosis not present

## 2019-03-18 DIAGNOSIS — N939 Abnormal uterine and vaginal bleeding, unspecified: Secondary | ICD-10-CM | POA: Diagnosis not present

## 2019-03-18 DIAGNOSIS — D509 Iron deficiency anemia, unspecified: Secondary | ICD-10-CM | POA: Diagnosis not present

## 2019-03-24 ENCOUNTER — Other Ambulatory Visit: Payer: Self-pay | Admitting: Obstetrics and Gynecology

## 2019-03-24 DIAGNOSIS — N800 Endometriosis of uterus: Secondary | ICD-10-CM | POA: Diagnosis not present

## 2019-03-24 DIAGNOSIS — N939 Abnormal uterine and vaginal bleeding, unspecified: Secondary | ICD-10-CM | POA: Diagnosis not present

## 2019-03-24 DIAGNOSIS — R102 Pelvic and perineal pain: Secondary | ICD-10-CM | POA: Diagnosis not present

## 2019-03-24 DIAGNOSIS — D500 Iron deficiency anemia secondary to blood loss (chronic): Secondary | ICD-10-CM | POA: Diagnosis not present

## 2019-03-24 DIAGNOSIS — N888 Other specified noninflammatory disorders of cervix uteri: Secondary | ICD-10-CM | POA: Diagnosis not present

## 2019-03-24 DIAGNOSIS — N920 Excessive and frequent menstruation with regular cycle: Secondary | ICD-10-CM | POA: Diagnosis not present

## 2019-03-24 DIAGNOSIS — D251 Intramural leiomyoma of uterus: Secondary | ICD-10-CM | POA: Diagnosis not present

## 2019-03-29 ENCOUNTER — Inpatient Hospital Stay (HOSPITAL_COMMUNITY)
Admission: EM | Admit: 2019-03-29 | Discharge: 2019-04-03 | DRG: 330 | Disposition: A | Discharge status: Home / Self Care | Payer: BC Managed Care – PPO | Attending: Surgery | Admitting: Surgery

## 2019-03-29 DIAGNOSIS — Z823 Family history of stroke: Secondary | ICD-10-CM

## 2019-03-29 DIAGNOSIS — K562 Volvulus: Secondary | ICD-10-CM

## 2019-03-29 DIAGNOSIS — K660 Peritoneal adhesions (postprocedural) (postinfection): Secondary | ICD-10-CM | POA: Diagnosis present

## 2019-03-29 DIAGNOSIS — Z8262 Family history of osteoporosis: Secondary | ICD-10-CM | POA: Diagnosis not present

## 2019-03-29 DIAGNOSIS — E872 Acidosis: Secondary | ICD-10-CM | POA: Diagnosis not present

## 2019-03-29 DIAGNOSIS — D649 Anemia, unspecified: Secondary | ICD-10-CM | POA: Diagnosis not present

## 2019-03-29 DIAGNOSIS — K56609 Unspecified intestinal obstruction, unspecified as to partial versus complete obstruction: Secondary | ICD-10-CM | POA: Diagnosis not present

## 2019-03-29 DIAGNOSIS — Z5331 Laparoscopic surgical procedure converted to open procedure: Secondary | ICD-10-CM | POA: Diagnosis not present

## 2019-03-29 DIAGNOSIS — Z90710 Acquired absence of both cervix and uterus: Secondary | ICD-10-CM

## 2019-03-29 DIAGNOSIS — F418 Other specified anxiety disorders: Secondary | ICD-10-CM | POA: Diagnosis not present

## 2019-03-29 DIAGNOSIS — Z880 Allergy status to penicillin: Secondary | ICD-10-CM | POA: Diagnosis not present

## 2019-03-29 DIAGNOSIS — Z20828 Contact with and (suspected) exposure to other viral communicable diseases: Secondary | ICD-10-CM | POA: Diagnosis not present

## 2019-03-29 DIAGNOSIS — R1084 Generalized abdominal pain: Secondary | ICD-10-CM | POA: Diagnosis not present

## 2019-03-29 DIAGNOSIS — Y838 Other surgical procedures as the cause of abnormal reaction of the patient, or of later complication, without mention of misadventure at the time of the procedure: Secondary | ICD-10-CM | POA: Diagnosis present

## 2019-03-29 DIAGNOSIS — R7989 Other specified abnormal findings of blood chemistry: Secondary | ICD-10-CM

## 2019-03-29 DIAGNOSIS — Z8249 Family history of ischemic heart disease and other diseases of the circulatory system: Secondary | ICD-10-CM

## 2019-03-29 DIAGNOSIS — D62 Acute posthemorrhagic anemia: Secondary | ICD-10-CM | POA: Diagnosis not present

## 2019-03-29 DIAGNOSIS — K567 Ileus, unspecified: Secondary | ICD-10-CM | POA: Diagnosis not present

## 2019-03-29 DIAGNOSIS — Z87891 Personal history of nicotine dependence: Secondary | ICD-10-CM

## 2019-03-29 DIAGNOSIS — K913 Postprocedural intestinal obstruction, unspecified as to partial versus complete: Secondary | ICD-10-CM | POA: Diagnosis not present

## 2019-03-29 DIAGNOSIS — R1110 Vomiting, unspecified: Secondary | ICD-10-CM | POA: Diagnosis not present

## 2019-03-29 DIAGNOSIS — R000 Tachycardia, unspecified: Secondary | ICD-10-CM | POA: Diagnosis not present

## 2019-03-29 DIAGNOSIS — M5489 Other dorsalgia: Secondary | ICD-10-CM | POA: Diagnosis not present

## 2019-03-29 DIAGNOSIS — R079 Chest pain, unspecified: Secondary | ICD-10-CM | POA: Diagnosis not present

## 2019-03-29 DIAGNOSIS — Z833 Family history of diabetes mellitus: Secondary | ICD-10-CM | POA: Diagnosis not present

## 2019-03-29 DIAGNOSIS — R0789 Other chest pain: Secondary | ICD-10-CM | POA: Diagnosis not present

## 2019-03-29 MED ORDER — HYDROMORPHONE HCL 1 MG/ML IJ SOLN
1.0000 mg | Freq: Once | INTRAMUSCULAR | Status: AC
  Administered 2019-03-30: 1 mg via INTRAVENOUS
  Filled 2019-03-29: qty 1

## 2019-03-29 MED ORDER — SODIUM CHLORIDE 0.9 % IV BOLUS
1000.0000 mL | Freq: Once | INTRAVENOUS | Status: AC
  Administered 2019-03-30: 1000 mL via INTRAVENOUS

## 2019-03-29 MED ORDER — ONDANSETRON HCL 4 MG/2ML IJ SOLN
4.0000 mg | Freq: Once | INTRAMUSCULAR | Status: AC
  Administered 2019-03-30: 4 mg via INTRAVENOUS
  Filled 2019-03-29: qty 2

## 2019-03-29 NOTE — ED Notes (Signed)
Mom Beulah Gandy will be coming to stay with the patient [phone]

## 2019-03-29 NOTE — ED Triage Notes (Signed)
[facility] reports that the pt had a hysterectomy on Wednesday, pt nauseous and vomiting and unable to keep meds down. 148/94, pt complaining pf chest pain and back pain. Abdominal pain. Pain started today. cbg 127, pulse 110. Afebrile.

## 2019-03-30 ENCOUNTER — Emergency Department (HOSPITAL_COMMUNITY): Payer: BC Managed Care – PPO | Admitting: Registered Nurse

## 2019-03-30 ENCOUNTER — Emergency Department (HOSPITAL_COMMUNITY): Payer: BC Managed Care – PPO

## 2019-03-30 ENCOUNTER — Encounter (HOSPITAL_COMMUNITY): Payer: Self-pay | Admitting: Radiology

## 2019-03-30 ENCOUNTER — Encounter (HOSPITAL_COMMUNITY): Admission: EM | Disposition: A | Discharge status: Home / Self Care | Payer: Self-pay | Source: Home / Self Care

## 2019-03-30 DIAGNOSIS — Y838 Other surgical procedures as the cause of abnormal reaction of the patient, or of later complication, without mention of misadventure at the time of the procedure: Secondary | ICD-10-CM | POA: Diagnosis present

## 2019-03-30 DIAGNOSIS — Z823 Family history of stroke: Secondary | ICD-10-CM | POA: Diagnosis not present

## 2019-03-30 DIAGNOSIS — K660 Peritoneal adhesions (postprocedural) (postinfection): Secondary | ICD-10-CM | POA: Diagnosis present

## 2019-03-30 DIAGNOSIS — Z5331 Laparoscopic surgical procedure converted to open procedure: Secondary | ICD-10-CM | POA: Diagnosis not present

## 2019-03-30 DIAGNOSIS — K562 Volvulus: Secondary | ICD-10-CM | POA: Diagnosis present

## 2019-03-30 DIAGNOSIS — K913 Postprocedural intestinal obstruction, unspecified as to partial versus complete: Secondary | ICD-10-CM | POA: Diagnosis present

## 2019-03-30 DIAGNOSIS — D62 Acute posthemorrhagic anemia: Secondary | ICD-10-CM | POA: Diagnosis not present

## 2019-03-30 DIAGNOSIS — Z833 Family history of diabetes mellitus: Secondary | ICD-10-CM | POA: Diagnosis not present

## 2019-03-30 DIAGNOSIS — Z20828 Contact with and (suspected) exposure to other viral communicable diseases: Secondary | ICD-10-CM | POA: Diagnosis present

## 2019-03-30 DIAGNOSIS — K567 Ileus, unspecified: Secondary | ICD-10-CM | POA: Diagnosis not present

## 2019-03-30 DIAGNOSIS — Z87891 Personal history of nicotine dependence: Secondary | ICD-10-CM | POA: Diagnosis not present

## 2019-03-30 DIAGNOSIS — Z8262 Family history of osteoporosis: Secondary | ICD-10-CM | POA: Diagnosis not present

## 2019-03-30 DIAGNOSIS — Z880 Allergy status to penicillin: Secondary | ICD-10-CM | POA: Diagnosis not present

## 2019-03-30 DIAGNOSIS — Z8249 Family history of ischemic heart disease and other diseases of the circulatory system: Secondary | ICD-10-CM | POA: Diagnosis not present

## 2019-03-30 DIAGNOSIS — Z90710 Acquired absence of both cervix and uterus: Secondary | ICD-10-CM | POA: Diagnosis not present

## 2019-03-30 HISTORY — PX: LAPAROSCOPY: SHX197

## 2019-03-30 LAB — CBC WITH DIFFERENTIAL/PLATELET
Abs Immature Granulocytes: 0.04 10*3/uL (ref 0.00–0.07)
Basophils Absolute: 0 10*3/uL (ref 0.0–0.1)
Basophils Relative: 0 %
Eosinophils Absolute: 0 10*3/uL (ref 0.0–0.5)
Eosinophils Relative: 0 %
HCT: 34.6 % — ABNORMAL LOW (ref 36.0–46.0)
Hemoglobin: 11.4 g/dL — ABNORMAL LOW (ref 12.0–15.0)
Immature Granulocytes: 0 %
Lymphocytes Relative: 8 %
Lymphs Abs: 0.9 10*3/uL (ref 0.7–4.0)
MCH: 29.3 pg (ref 26.0–34.0)
MCHC: 32.9 g/dL (ref 30.0–36.0)
MCV: 88.9 fL (ref 80.0–100.0)
Monocytes Absolute: 0.6 10*3/uL (ref 0.1–1.0)
Monocytes Relative: 6 %
Neutro Abs: 9.1 10*3/uL — ABNORMAL HIGH (ref 1.7–7.7)
Neutrophils Relative %: 86 %
Platelets: 334 10*3/uL (ref 150–400)
RBC: 3.89 MIL/uL (ref 3.87–5.11)
RDW: 11.9 % (ref 11.5–15.5)
WBC: 10.7 10*3/uL — ABNORMAL HIGH (ref 4.0–10.5)
nRBC: 0 % (ref 0.0–0.2)

## 2019-03-30 LAB — COMPREHENSIVE METABOLIC PANEL
ALT: 15 U/L (ref 0–44)
AST: 20 U/L (ref 15–41)
Albumin: 4.4 g/dL (ref 3.5–5.0)
Alkaline Phosphatase: 44 U/L (ref 38–126)
Anion gap: 21 — ABNORMAL HIGH (ref 5–15)
BUN: 6 mg/dL (ref 6–20)
CO2: 20 mmol/L — ABNORMAL LOW (ref 22–32)
Calcium: 10.1 mg/dL (ref 8.9–10.3)
Chloride: 96 mmol/L — ABNORMAL LOW (ref 98–111)
Creatinine, Ser: 0.8 mg/dL (ref 0.44–1.00)
GFR calc Af Amer: >60 mL/min (ref >60)
GFR calc non Af Amer: >60 mL/min (ref >60)
Glucose, Bld: 137 mg/dL — ABNORMAL HIGH (ref 70–99)
Potassium: 3.8 mmol/L (ref 3.5–5.1)
Sodium: 137 mmol/L (ref 135–145)
Total Bilirubin: 1.6 mg/dL — ABNORMAL HIGH (ref 0.3–1.2)
Total Protein: 8.9 g/dL — ABNORMAL HIGH (ref 6.5–8.1)

## 2019-03-30 LAB — PROTIME-INR
INR: 1.1 (ref 0.8–1.2)
Prothrombin Time: 14.3 seconds (ref 11.4–15.2)

## 2019-03-30 LAB — POCT I-STAT EG7
Acid-base deficit: 4 mmol/L — ABNORMAL HIGH (ref 0.0–2.0)
Bicarbonate: 20.2 mmol/L (ref 20.0–28.0)
Calcium, Ion: 1.06 mmol/L — ABNORMAL LOW (ref 1.15–1.40)
HCT: 24 % — ABNORMAL LOW (ref 36.0–46.0)
Hemoglobin: 8.2 g/dL — ABNORMAL LOW (ref 12.0–15.0)
O2 Saturation: 100 %
Patient temperature: 35.4
Potassium: 3.6 mmol/L (ref 3.5–5.1)
Sodium: 136 mmol/L (ref 135–145)
TCO2: 21 mmol/L — ABNORMAL LOW (ref 22–32)
pCO2, Ven: 29.7 mmHg — ABNORMAL LOW (ref 44.0–60.0)
pH, Ven: 7.433 — ABNORMAL HIGH (ref 7.250–7.430)
pO2, Ven: 227 mmHg — ABNORMAL HIGH (ref 32.0–45.0)

## 2019-03-30 LAB — LACTIC ACID, PLASMA
Lactic Acid, Venous: 3.3 mmol/L (ref 0.5–1.9)
Lactic Acid, Venous: 0.9 mmol/L (ref 0.5–1.9)

## 2019-03-30 LAB — URINALYSIS, ROUTINE W REFLEX MICROSCOPIC
Bilirubin Urine: NEGATIVE
Glucose, UA: NEGATIVE mg/dL
Ketones, ur: 80 mg/dL — AB
Nitrite: NEGATIVE
Protein, ur: 30 mg/dL — AB
Specific Gravity, Urine: 1.027 (ref 1.005–1.030)
pH: 5 (ref 5.0–8.0)

## 2019-03-30 LAB — APTT: aPTT: 22 seconds — ABNORMAL LOW (ref 24–36)

## 2019-03-30 LAB — CREATININE, SERUM
Creatinine, Ser: 0.58 mg/dL (ref 0.44–1.00)
GFR calc Af Amer: >60 mL/min (ref >60)
GFR calc non Af Amer: >60 mL/min (ref >60)

## 2019-03-30 LAB — LIPASE, BLOOD: Lipase: 20 U/L (ref 11–51)

## 2019-03-30 LAB — CBC
HCT: 27.7 % — ABNORMAL LOW (ref 36.0–46.0)
Hemoglobin: 9.4 g/dL — ABNORMAL LOW (ref 12.0–15.0)
MCH: 29.2 pg (ref 26.0–34.0)
MCHC: 33.9 g/dL (ref 30.0–36.0)
MCV: 86 fL (ref 80.0–100.0)
Platelets: 256 10*3/uL (ref 150–400)
RBC: 3.22 MIL/uL — ABNORMAL LOW (ref 3.87–5.11)
RDW: 12.9 % (ref 11.5–15.5)
WBC: 10.2 10*3/uL (ref 4.0–10.5)
nRBC: 0 % (ref 0.0–0.2)

## 2019-03-30 LAB — ABO/RH: ABO/RH(D): B POS

## 2019-03-30 LAB — PREPARE RBC (CROSSMATCH)

## 2019-03-30 LAB — SARS CORONAVIRUS 2 BY RT PCR (HOSPITAL ORDER, PERFORMED IN ~~LOC~~ HOSPITAL LAB): SARS Coronavirus 2: NEGATIVE

## 2019-03-30 SURGERY — LAPAROSCOPY, DIAGNOSTIC
Anesthesia: General | Site: Abdomen

## 2019-03-30 MED ORDER — MIDAZOLAM HCL 5 MG/5ML IJ SOLN
INTRAMUSCULAR | Status: DC | PRN
  Administered 2019-03-30: 2 mg via INTRAVENOUS

## 2019-03-30 MED ORDER — PROPOFOL 10 MG/ML IV BOLUS
INTRAVENOUS | Status: AC
  Filled 2019-03-30: qty 20

## 2019-03-30 MED ORDER — FENTANYL CITRATE (PF) 250 MCG/5ML IJ SOLN
INTRAMUSCULAR | Status: AC
  Filled 2019-03-30: qty 5

## 2019-03-30 MED ORDER — ROCURONIUM BROMIDE 10 MG/ML (PF) SYRINGE
PREFILLED_SYRINGE | INTRAVENOUS | Status: AC
  Filled 2019-03-30: qty 10

## 2019-03-30 MED ORDER — ROCURONIUM BROMIDE 10 MG/ML (PF) SYRINGE
PREFILLED_SYRINGE | INTRAVENOUS | Status: DC | PRN
  Administered 2019-03-30: 10 mg via INTRAVENOUS
  Administered 2019-03-30: 50 mg via INTRAVENOUS

## 2019-03-30 MED ORDER — SODIUM CHLORIDE 0.9 % IV SOLN
INTRAVENOUS | Status: DC | PRN
  Administered 2019-03-30: 05:00:00 via INTRAVENOUS

## 2019-03-30 MED ORDER — 0.9 % SODIUM CHLORIDE (POUR BTL) OPTIME
TOPICAL | Status: DC | PRN
  Administered 2019-03-30 (×5): 1000 mL

## 2019-03-30 MED ORDER — ACETAMINOPHEN 10 MG/ML IV SOLN
INTRAVENOUS | Status: AC
  Filled 2019-03-30: qty 100

## 2019-03-30 MED ORDER — ACETAMINOPHEN 500 MG PO TABS: 1000.0000 mg | ORAL_TABLET | Freq: Once | ORAL | Status: DC | PRN

## 2019-03-30 MED ORDER — PHENYLEPHRINE 40 MCG/ML (10ML) SYRINGE FOR IV PUSH (FOR BLOOD PRESSURE SUPPORT)
PREFILLED_SYRINGE | INTRAVENOUS | Status: DC | PRN
  Administered 2019-03-30: 40 ug via INTRAVENOUS
  Administered 2019-03-30: 80 ug via INTRAVENOUS

## 2019-03-30 MED ORDER — SUCCINYLCHOLINE CHLORIDE 200 MG/10ML IV SOSY
PREFILLED_SYRINGE | INTRAVENOUS | Status: DC | PRN
  Administered 2019-03-30: 100 mg via INTRAVENOUS

## 2019-03-30 MED ORDER — LIDOCAINE 2% (20 MG/ML) 5 ML SYRINGE
INTRAMUSCULAR | Status: DC | PRN
  Administered 2019-03-30: 60 mg via INTRAVENOUS

## 2019-03-30 MED ORDER — VANCOMYCIN HCL IN DEXTROSE 1-5 GM/200ML-% IV SOLN
INTRAVENOUS | Status: AC
  Filled 2019-03-30: qty 200

## 2019-03-30 MED ORDER — ONDANSETRON HCL 4 MG/2ML IJ SOLN
INTRAMUSCULAR | Status: AC
  Filled 2019-03-30: qty 2

## 2019-03-30 MED ORDER — PHENYLEPHRINE 40 MCG/ML (10ML) SYRINGE FOR IV PUSH (FOR BLOOD PRESSURE SUPPORT)
PREFILLED_SYRINGE | INTRAVENOUS | Status: AC
  Filled 2019-03-30: qty 10

## 2019-03-30 MED ORDER — BUPIVACAINE-EPINEPHRINE 0.5% -1:200000 IJ SOLN
INTRAMUSCULAR | Status: AC
  Filled 2019-03-30: qty 1

## 2019-03-30 MED ORDER — EPHEDRINE 5 MG/ML INJ
INTRAVENOUS | Status: AC
  Filled 2019-03-30: qty 10

## 2019-03-30 MED ORDER — ACETAMINOPHEN 10 MG/ML IV SOLN: 1000.0000 mg | Freq: Once | INTRAVENOUS | Status: DC | PRN

## 2019-03-30 MED ORDER — KETOROLAC TROMETHAMINE 15 MG/ML IJ SOLN
INTRAMUSCULAR | Status: AC
  Filled 2019-03-30: qty 1

## 2019-03-30 MED ORDER — OXYCODONE HCL 5 MG PO TABS: 5.0000 mg | ORAL_TABLET | Freq: Once | ORAL | Status: DC | PRN

## 2019-03-30 MED ORDER — LIDOCAINE 2% (20 MG/ML) 5 ML SYRINGE
INTRAMUSCULAR | Status: AC
  Filled 2019-03-30: qty 5

## 2019-03-30 MED ORDER — DEXAMETHASONE SODIUM PHOSPHATE 10 MG/ML IJ SOLN
INTRAMUSCULAR | Status: DC | PRN
  Administered 2019-03-30: 10 mg via INTRAVENOUS

## 2019-03-30 MED ORDER — ACETAMINOPHEN 650 MG RE SUPP
650.0000 mg | Freq: Four times a day (QID) | RECTAL | Status: DC
  Administered 2019-03-31 – 2019-04-01 (×2): 650 mg via RECTAL
  Filled 2019-03-30 (×3): qty 1

## 2019-03-30 MED ORDER — HYDROMORPHONE HCL 1 MG/ML IJ SOLN
INTRAMUSCULAR | Status: DC | PRN
  Administered 2019-03-30: 0.5 mg via INTRAVENOUS

## 2019-03-30 MED ORDER — KETAMINE HCL 10 MG/ML IJ SOLN
INTRAMUSCULAR | Status: DC | PRN
  Administered 2019-03-30: 20 mg via INTRAVENOUS

## 2019-03-30 MED ORDER — SODIUM CHLORIDE 0.9 % IR SOLN
Status: DC | PRN
  Administered 2019-03-30: 1000 mL

## 2019-03-30 MED ORDER — IOHEXOL 300 MG/ML  SOLN
100.0000 mL | Freq: Once | INTRAMUSCULAR | Status: AC | PRN
  Administered 2019-03-30: 100 mL via INTRAVENOUS

## 2019-03-30 MED ORDER — LACTATED RINGERS IV SOLN
INTRAVENOUS | Status: DC | PRN
  Administered 2019-03-30 (×3): via INTRAVENOUS

## 2019-03-30 MED ORDER — KETAMINE HCL 50 MG/5ML IJ SOSY
PREFILLED_SYRINGE | INTRAMUSCULAR | Status: AC
  Filled 2019-03-30: qty 5

## 2019-03-30 MED ORDER — SUGAMMADEX SODIUM 200 MG/2ML IV SOLN
INTRAVENOUS | Status: DC | PRN
  Administered 2019-03-30: 200 mg via INTRAVENOUS
  Administered 2019-03-30: 100 mg via INTRAVENOUS

## 2019-03-30 MED ORDER — ACETAMINOPHEN 10 MG/ML IV SOLN
INTRAVENOUS | Status: DC | PRN
  Administered 2019-03-30: 1000 mg via INTRAVENOUS

## 2019-03-30 MED ORDER — MIDAZOLAM HCL 2 MG/2ML IJ SOLN
INTRAMUSCULAR | Status: AC
  Filled 2019-03-30: qty 2

## 2019-03-30 MED ORDER — KCL IN DEXTROSE-NACL 20-5-0.45 MEQ/L-%-% IV SOLN
INTRAVENOUS | Status: DC
  Administered 2019-03-30 – 2019-04-02 (×6): via INTRAVENOUS
  Filled 2019-03-30 (×7): qty 1000

## 2019-03-30 MED ORDER — ONDANSETRON HCL 4 MG/2ML IJ SOLN
4.0000 mg | Freq: Four times a day (QID) | INTRAMUSCULAR | Status: DC | PRN
  Administered 2019-03-31 – 2019-04-03 (×3): 4 mg via INTRAVENOUS
  Filled 2019-03-30 (×3): qty 2

## 2019-03-30 MED ORDER — PROPOFOL 10 MG/ML IV BOLUS
INTRAVENOUS | Status: DC | PRN
  Administered 2019-03-30: 130 mg via INTRAVENOUS

## 2019-03-30 MED ORDER — MORPHINE SULFATE (PF) 4 MG/ML IV SOLN
4.0000 mg | INTRAVENOUS | Status: DC | PRN
  Administered 2019-03-30 – 2019-04-02 (×8): 4 mg via INTRAVENOUS
  Filled 2019-03-30 (×8): qty 1

## 2019-03-30 MED ORDER — SUCCINYLCHOLINE CHLORIDE 200 MG/10ML IV SOSY
PREFILLED_SYRINGE | INTRAVENOUS | Status: AC
  Filled 2019-03-30: qty 10

## 2019-03-30 MED ORDER — BUPIVACAINE HCL (PF) 0.5 % IJ SOLN
INTRAMUSCULAR | Status: AC
  Filled 2019-03-30: qty 30

## 2019-03-30 MED ORDER — ONDANSETRON 4 MG PO TBDP: 4.0000 mg | ORAL_TABLET | Freq: Four times a day (QID) | ORAL | Status: DC | PRN

## 2019-03-30 MED ORDER — PHENYLEPHRINE HCL-NACL 10-0.9 MG/250ML-% IV SOLN
INTRAVENOUS | Status: DC | PRN
  Administered 2019-03-30: 25 ug/min via INTRAVENOUS

## 2019-03-30 MED ORDER — HYDROMORPHONE HCL 1 MG/ML IJ SOLN
INTRAMUSCULAR | Status: AC
  Filled 2019-03-30: qty 1

## 2019-03-30 MED ORDER — ONDANSETRON HCL 4 MG/2ML IJ SOLN
INTRAMUSCULAR | Status: DC | PRN
  Administered 2019-03-30: 4 mg via INTRAVENOUS

## 2019-03-30 MED ORDER — HYDROMORPHONE HCL 1 MG/ML IJ SOLN
INTRAMUSCULAR | Status: AC
  Filled 2019-03-30: qty 0.5

## 2019-03-30 MED ORDER — MORPHINE SULFATE (PF) 4 MG/ML IV SOLN
INTRAVENOUS | Status: AC
  Filled 2019-03-30: qty 1

## 2019-03-30 MED ORDER — KETOROLAC TROMETHAMINE 15 MG/ML IJ SOLN
15.0000 mg | Freq: Four times a day (QID) | INTRAMUSCULAR | Status: DC | PRN
  Administered 2019-03-30 (×2): 15 mg via INTRAVENOUS
  Filled 2019-03-30: qty 1

## 2019-03-30 MED ORDER — FENTANYL CITRATE (PF) 250 MCG/5ML IJ SOLN
INTRAMUSCULAR | Status: DC | PRN
  Administered 2019-03-30 (×6): 50 ug via INTRAVENOUS

## 2019-03-30 MED ORDER — OXYCODONE HCL 5 MG/5ML PO SOLN: 5.0000 mg | Freq: Once | ORAL | Status: DC | PRN

## 2019-03-30 MED ORDER — DEXAMETHASONE SODIUM PHOSPHATE 10 MG/ML IJ SOLN
INTRAMUSCULAR | Status: AC
  Filled 2019-03-30: qty 1

## 2019-03-30 MED ORDER — ACETAMINOPHEN 160 MG/5ML PO SOLN: 1000.0000 mg | Freq: Once | ORAL | Status: DC | PRN

## 2019-03-30 MED ORDER — PHENOL 1.4 % MT LIQD: 1.0000 | OROMUCOSAL | Status: DC | PRN

## 2019-03-30 MED ORDER — CHLORHEXIDINE GLUCONATE CLOTH 2 % EX PADS: 6.0000 | MEDICATED_PAD | Freq: Every day | CUTANEOUS | Status: DC

## 2019-03-30 MED ORDER — METHOCARBAMOL 1000 MG/10ML IJ SOLN
1000.0000 mg | Freq: Three times a day (TID) | INTRAVENOUS | Status: DC
  Administered 2019-03-30 – 2019-04-03 (×10): 1000 mg via INTRAVENOUS
  Filled 2019-03-30 (×15): qty 10

## 2019-03-30 MED ORDER — HYDROMORPHONE HCL 1 MG/ML IJ SOLN
1.0000 mg | Freq: Once | INTRAMUSCULAR | Status: AC
  Administered 2019-03-30: 1 mg via INTRAVENOUS
  Filled 2019-03-30: qty 1

## 2019-03-30 MED ORDER — HYDROMORPHONE HCL 1 MG/ML IJ SOLN
0.2500 mg | INTRAMUSCULAR | Status: DC | PRN
  Administered 2019-03-30 (×4): 0.5 mg via INTRAVENOUS

## 2019-03-30 MED ORDER — VANCOMYCIN HCL 1000 MG IV SOLR
INTRAVENOUS | Status: DC | PRN
  Administered 2019-03-30: 05:00:00 1000 mg via INTRAVENOUS

## 2019-03-30 MED ORDER — ALBUMIN HUMAN 5 % IV SOLN
INTRAVENOUS | Status: DC | PRN
  Administered 2019-03-30 (×2): via INTRAVENOUS

## 2019-03-30 MED ORDER — SODIUM CHLORIDE 0.9 % IV SOLN: 10.0000 mL/h | Freq: Once | INTRAVENOUS | Status: DC

## 2019-03-30 MED ORDER — BUPIVACAINE HCL (PF) 0.25 % IJ SOLN
INTRAMUSCULAR | Status: AC
  Filled 2019-03-30: qty 10

## 2019-03-30 MED ORDER — DOCUSATE SODIUM 100 MG PO CAPS
100.0000 mg | ORAL_CAPSULE | Freq: Two times a day (BID) | ORAL | Status: DC
  Filled 2019-03-30 (×3): qty 1

## 2019-03-30 MED ORDER — ENOXAPARIN SODIUM 40 MG/0.4ML ~~LOC~~ SOLN
40.0000 mg | SUBCUTANEOUS | Status: DC
  Administered 2019-03-31 – 2019-04-03 (×4): 40 mg via SUBCUTANEOUS
  Filled 2019-03-30 (×4): qty 0.4

## 2019-03-30 SURGICAL SUPPLY — 64 items
APL PRP STRL LF DISP 70% ISPRP (MISCELLANEOUS) ×1
APL SKNCLS STERI-STRIP NONHPOA (GAUZE/BANDAGES/DRESSINGS) ×1
APPLIER CLIP ROT 10 11.4 M/L (STAPLE)
APR CLP MED LRG 11.4X10 (STAPLE)
BAG SPEC RTRVL LRG 6X4 10 (ENDOMECHANICALS)
BENZOIN TINCTURE PRP APPL 2/3 (GAUZE/BANDAGES/DRESSINGS) ×3 IMPLANT
BLADE CLIPPER SURG (BLADE) IMPLANT
CANISTER SUCT 3000ML PPV (MISCELLANEOUS) IMPLANT
CHLORAPREP W/TINT 26 (MISCELLANEOUS) ×3 IMPLANT
CLIP APPLIE ROT 10 11.4 M/L (STAPLE) IMPLANT
CLOSURE WOUND 1/2 X4 (GAUZE/BANDAGES/DRESSINGS)
COVER SURGICAL LIGHT HANDLE (MISCELLANEOUS) ×3 IMPLANT
COVER WAND RF STERILE (DRAPES) ×1 IMPLANT
CUTTER FLEX LINEAR 45M (STAPLE) ×3 IMPLANT
DRSG TEGADERM 2-3/8X2-3/4 SM (GAUZE/BANDAGES/DRESSINGS) ×4 IMPLANT
DRSG TEGADERM 4X4.75 (GAUZE/BANDAGES/DRESSINGS) ×1 IMPLANT
DRSG VAC ATS SM SENSATRAC (GAUZE/BANDAGES/DRESSINGS) ×2 IMPLANT
ELECT REM PT RETURN 9FT ADLT (ELECTROSURGICAL) ×3
ELECTRODE REM PT RTRN 9FT ADLT (ELECTROSURGICAL) ×1 IMPLANT
ENDOLOOP SUT PDS II  0 18 (SUTURE)
ENDOLOOP SUT PDS II 0 18 (SUTURE) IMPLANT
GAUZE SPONGE 2X2 8PLY STRL LF (GAUZE/BANDAGES/DRESSINGS) ×1 IMPLANT
GLOVE BIO SURGEON STRL SZ 6.5 (GLOVE) ×2 IMPLANT
GLOVE BIO SURGEONS STRL SZ 6.5 (GLOVE) ×1
GLOVE BIOGEL PI IND STRL 6 (GLOVE) ×1 IMPLANT
GLOVE BIOGEL PI INDICATOR 6 (GLOVE) ×2
GOWN STRL REUS W/ TWL LRG LVL3 (GOWN DISPOSABLE) ×3 IMPLANT
GOWN STRL REUS W/TWL LRG LVL3 (GOWN DISPOSABLE) ×9
HANDLE SUCTION POOLE (INSTRUMENTS) IMPLANT
KIT BASIN OR (CUSTOM PROCEDURE TRAY) ×3 IMPLANT
KIT TURNOVER KIT B (KITS) ×3 IMPLANT
LIGASURE IMPACT 36 18CM CVD LR (INSTRUMENTS) ×2 IMPLANT
NS IRRIG 1000ML POUR BTL (IV SOLUTION) ×3 IMPLANT
PAD ARMBOARD 7.5X6 YLW CONV (MISCELLANEOUS) ×6 IMPLANT
POUCH SPECIMEN RETRIEVAL 10MM (ENDOMECHANICALS) ×1 IMPLANT
RELOAD PROXIMATE 75MM BLUE (ENDOMECHANICALS) ×12 IMPLANT
RELOAD STAPLE 45 3.5 BLU ETS (ENDOMECHANICALS) ×1 IMPLANT
RELOAD STAPLE 75 3.8 BLU REG (ENDOMECHANICALS) IMPLANT
RELOAD STAPLE TA45 3.5 REG BLU (ENDOMECHANICALS) IMPLANT
SCISSORS LAP 5X35 DISP (ENDOMECHANICALS) ×2 IMPLANT
SET IRRIG TUBING LAPAROSCOPIC (IRRIGATION / IRRIGATOR) IMPLANT
SET TUBE SMOKE EVAC HIGH FLOW (TUBING) ×3 IMPLANT
SHEARS HARMONIC ACE PLUS 36CM (ENDOMECHANICALS) ×3 IMPLANT
SLEEVE ENDOPATH XCEL 5M (ENDOMECHANICALS) ×1 IMPLANT
SPECIMEN JAR SMALL (MISCELLANEOUS) ×3 IMPLANT
SPONGE GAUZE 2X2 8PLY STER LF (GAUZE/BANDAGES/DRESSINGS) ×1
SPONGE GAUZE 2X2 8PLY STRL LF (GAUZE/BANDAGES/DRESSINGS) ×1 IMPLANT
SPONGE GAUZE 2X2 STER 10/PKG (GAUZE/BANDAGES/DRESSINGS)
STAPLER PROXIMATE 75MM BLUE (STAPLE) ×2 IMPLANT
STRIP CLOSURE SKIN 1/2X4 (GAUZE/BANDAGES/DRESSINGS) ×1 IMPLANT
SUCTION POOLE HANDLE (INSTRUMENTS) ×3
SUT MNCRL AB 4-0 PS2 18 (SUTURE) ×3 IMPLANT
SUT PDS AB 1 TP1 96 (SUTURE) ×4 IMPLANT
SUT SILK 2 0 SH (SUTURE) ×2 IMPLANT
SUT SILK 3 0SH CR/8 30 (SUTURE) ×4 IMPLANT
SUT VIC AB 3-0 SH 8-18 (SUTURE) ×2 IMPLANT
TOWEL GREEN STERILE (TOWEL DISPOSABLE) ×3 IMPLANT
TOWEL GREEN STERILE FF (TOWEL DISPOSABLE) ×3 IMPLANT
TRAY LAPAROSCOPIC MC (CUSTOM PROCEDURE TRAY) ×3 IMPLANT
TROCAR XCEL NON-BLD 11X100MML (ENDOMECHANICALS) ×3 IMPLANT
TROCAR XCEL NON-BLD 5MMX100MML (ENDOMECHANICALS) ×3 IMPLANT
TUBE CONNECTING 12'X1/4 (SUCTIONS) ×1
TUBE CONNECTING 12X1/4 (SUCTIONS) ×1 IMPLANT
WATER STERILE IRR 1000ML POUR (IV SOLUTION) ×3 IMPLANT

## 2019-03-30 NOTE — OR Nursing (Signed)
Case converted from Laparoscopic to Laparotomy

## 2019-03-30 NOTE — Progress Notes (Signed)
Reason for Consult:post operative abdominal pain Referring Physician: Delora Fuel MD  Rebecca Lane is an 38 y.o. female S/P LAVH/BS with Dr Louretta Shorten at [facility] 05/23/18. She reports feeling OK while taking pain medication after surgery. She has been tolerating a regular diet, ambulating, voiding and BM Sunday and Monday. On Monday she developed increasing abdominal pain with nausea and vomiting of all intake including pain medication.  Pertinent Gynecological History: Menses: Hysterectomy Bleeding: N/A Contraception: status post hysterectomy DES exposure: denies Blood transfusions: unknown Sexually transmitted diseases: no past history Previous GYN Procedures: DNC  Last mammogram: unknown Date: N/A Last pap: normal Date: 2020 OB History: G2, P2   Menstrual History: Menarche age: unknown No LMP recorded.    Past Medical History:  Diagnosis Date  . Allergy   . Anemia   . Anxiety   . Depression   . Diverticulitis   . Heart murmur   . Hx of ovarian cyst   . Hx of varicella   . Preeclampsia   . Uterine polyp     Past Surgical History:  Procedure Laterality Date  . DILATION AND CURETTAGE OF UTERUS    . WISDOM TOOTH EXTRACTION      Family History  Problem Relation Age of Onset  . Hypertension Mother   . Osteoporosis Mother   . Stroke Maternal Grandmother   . Hypertension Maternal Grandmother   . Diabetes Maternal Grandmother   . Thrombophlebitis Paternal Grandfather   . Diabetes Other        niece    Social History:  reports that she quit smoking about 10 years ago. She has never used smokeless tobacco. She reports current alcohol use of about 1.0 standard drinks of alcohol per week. She reports that she does not use drugs.  Allergies:  Allergies  Allergen Reactions  . Penicillins Other (See Comments)    Reaction:  Unknown--childhood reaction  Has patient had a PCN reaction causing immediate rash, facial/tongue/throat swelling, SOB or  lightheadedness with hypotension: unknown Has patient had a PCN reaction causing severe rash involving mucus membranes or skin necrosis: unknown Has patient had a PCN reaction that required hospitalization : unknown Has patient had a PCN reaction occurring within the last 10 years: no If all answers are "NO", then may proceed with Cephalosporin use.     Medications: I have reviewed the patient's current medications.  ROS  Blood pressure 121/89, temperature 98.8 F (37.1 C), temperature source Oral, resp. rate (!) 21, height 5\' 8"  (1.727 m), weight 73.9 kg. Physical Exam  NAD, resting Cor RRR Abdomen soft, few BS, tender throughout   Results for orders placed or performed during the hospital encounter of 03/29/19 (from the past 48 hour(s))  Comprehensive metabolic panel     Status: Abnormal   Collection Time: 03/29/19 11:57 PM  Result Value Ref Range   Sodium 137 135 - 145 mmol/L   Potassium 3.8 3.5 - 5.1 mmol/L   Chloride 96 (L) 98 - 111 mmol/L   CO2 20 (L) 22 - 32 mmol/L   Glucose, Bld 137 (H) 70 - 99 mg/dL   BUN 6 6 - 20 mg/dL   Creatinine, Ser 0.80 0.44 - 1.00 mg/dL   Calcium 10.1 8.9 - 10.3 mg/dL   Total Protein 8.9 (H) 6.5 - 8.1 g/dL   Albumin 4.4 3.5 - 5.0 g/dL   AST 20 15 - 41 U/L   ALT 15 0 - 44 U/L   Alkaline Phosphatase 44 38 - 126  U/L   Total Bilirubin 1.6 (H) 0.3 - 1.2 mg/dL   GFR calc non Af Amer >60 >60 mL/min   GFR calc Af Amer >60 >60 mL/min   Anion gap 21 (H) 5 - 15    Comment: Performed at [facility] [address] [place], [place] [address]  Lipase, blood     Status: None   Collection Time: 03/29/19 11:57 PM  Result Value Ref Range   Lipase 20 11 - 51 U/L    Comment: Performed at [facility], [place] [address] [place], [place] [address]  CBC with Differential     Status: Abnormal   Collection Time: 03/29/19 11:57 PM  Result Value Ref Range   WBC 10.7 (H) 4.0 - 10.5 K/uL   RBC 3.89 3.87 - 5.11 MIL/uL   Hemoglobin 11.4 (L) 12.0 -  15.0 g/dL   HCT 34.6 (L) 36.0 - 46.0 %   MCV 88.9 80.0 - 100.0 fL   MCH 29.3 26.0 - 34.0 pg   MCHC 32.9 30.0 - 36.0 g/dL   RDW 11.9 11.5 - 15.5 %   Platelets 334 150 - 400 K/uL   nRBC 0.0 0.0 - 0.2 %   Neutrophils Relative % 86 %   Neutro Abs 9.1 (H) 1.7 - 7.7 K/uL   Lymphocytes Relative 8 %   Lymphs Abs 0.9 0.7 - 4.0 K/uL   Monocytes Relative 6 %   Monocytes Absolute 0.6 0.1 - 1.0 K/uL   Eosinophils Relative 0 %   Eosinophils Absolute 0.0 0.0 - 0.5 K/uL   Basophils Relative 0 %   Basophils Absolute 0.0 0.0 - 0.1 K/uL   Immature Granulocytes 0 %   Abs Immature Granulocytes 0.04 0.00 - 0.07 K/uL    Comment: Performed at [facility] [address] [place], [place] [address]  Lactic acid, plasma     Status: Abnormal   Collection Time: 03/29/19 11:57 PM  Result Value Ref Range   Lactic Acid, Venous 3.3 (HH) 0.5 - 1.9 mmol/L    Comment: CRITICAL RESULT CALLED TO, READ BACK BY AND VERIFIED WITH: RN P JOHNSON @0122  03/30/19 BY S GEZAHEGN Performed at [facility], [place] [address] [place], [place] [address]   Urinalysis, Routine w reflex microscopic     Status: Abnormal   Collection Time: 03/30/19  3:10 AM  Result Value Ref Range   Color, Urine YELLOW YELLOW   APPearance HAZY (A) CLEAR   Specific Gravity, Urine 1.027 1.005 - 1.030   pH 5.0 5.0 - 8.0   Glucose, UA NEGATIVE NEGATIVE mg/dL   Hgb urine dipstick LARGE (A) NEGATIVE   Bilirubin Urine NEGATIVE NEGATIVE   Ketones, ur 80 (A) NEGATIVE mg/dL   Protein, ur 30 (A) NEGATIVE mg/dL   Nitrite NEGATIVE NEGATIVE   Leukocytes,Ua TRACE (A) NEGATIVE   RBC / HPF 6-10 0 - 5 RBC/hpf   WBC, UA 0-5 0 - 5 WBC/hpf   Bacteria, UA RARE (A) NONE SEEN   Squamous Epithelial / LPF 0-5 0 - 5   Mucus PRESENT    Hyaline Casts, UA PRESENT     Comment: Performed at [facility], [place] [address] [place], [place] [address]    Ct Abdomen Pelvis W Contrast  Result Date: 03/30/2019 CLINICAL DATA:  Post hysterectomy.  Abdominal  pain, nausea, vomiting EXAM: CT ABDOMEN AND PELVIS WITH CONTRAST TECHNIQUE: Multidetector CT imaging of the abdomen and pelvis was performed using the standard protocol following  bolus administration of intravenous contrast. CONTRAST:  152mL OMNIPAQUE IOHEXOL 300 MG/ML  SOLN COMPARISON:  04/03/2016 FINDINGS: Lower chest: Lung bases are clear. No effusions. Heart is normal size. Hepatobiliary: No focal hepatic abnormality. Gallbladder unremarkable. Pancreas: No focal abnormality or ductal dilatation. Spleen: No focal abnormality.  Normal size. Adrenals/Urinary Tract: No adrenal abnormality. No focal renal abnormality. No stones or hydronephrosis. Urinary bladder is unremarkable. Stomach/Bowel: Dilated fluid-filled small bowel loops noted in the abdomen and extending into the pelvis. Large bowel is decompressed. Distal small bowel loops are decompressed. There is a swirling appearance of the small bowel loops and mesenteric vessels in the pelvis concerning for volvulus. Vascular/Lymphatic: No evidence of aneurysm or adenopathy. Reproductive: Prior hysterectomy. Complex fluid/soft tissue noted in the pelvis, likely postoperative hematoma. Other: No free air. Musculoskeletal: No acute bony abnormality. IMPRESSION: Dilated fluid-filled small bowel loops into the pelvis where there is a swirled appearance. Appearance is concerning for volvulus and associated small bowel obstruction. Complex fluid/soft tissue in the pelvis status post recent hysterectomy. This likely reflects postoperative hematoma. Electronically Signed   By: Rolm Baptise M.D.   On: 03/30/2019 02:03    Assessment/Plan: 38 yo G2P2  S/P LAVH/BS Abdominal pain with possible volvulus I D/W Dr Bobbye Morton who plans to take patient to OR and requests that I assist  Shon Millet II 03/30/2019

## 2019-03-30 NOTE — Anesthesia Postprocedure Evaluation (Signed)
Anesthesia Post Note  Patient: Rebecca Lane  Procedure(s) Performed: LAPAROSCOPY DIAGNOSTIC, EXPLORATORY LAPAROTOMY, SMALL BOWEL RESECTION (N/A Abdomen)     Patient location during evaluation: PACU Anesthesia Type: General Level of consciousness: awake and alert, oriented and patient cooperative Pain management: pain level controlled Vital Signs Assessment: post-procedure vital signs reviewed and stable Respiratory status: spontaneous breathing, nonlabored ventilation and respiratory function stable Cardiovascular status: blood pressure returned to baseline and stable Postop Assessment: no apparent nausea or vomiting Anesthetic complications: no    Last Vitals:  Vitals:   03/30/19 1055 03/30/19 1100  BP: 130/88   Pulse: 88 (!) 107  Resp: 14 (!) 22  Temp:    SpO2: 100% 100%    Last Pain:  Vitals:   03/30/19 0900  TempSrc:   PainSc: [place]

## 2019-03-30 NOTE — Op Note (Signed)
   Operative Note   Date: 03/30/2019  Procedure: diagnostic laparoscopy converted to exploratory laparotomy, small bowel resection  Pre-op diagnosis: volvulus Post-op diagnosis: volvulus secondary to small bowel adhesions to vaginal cuff  Indication and clinical history: The patient is a 38 y.o. year old female POD6 s/p lap assisted vaginal hysterectomy p/w volvulus.      Surgeon: Jesusita Oka, MD Assistant: Georganna Skeans, MD, Everlene Farrier, MD  Anesthesiologist: Dr. Ermalene Postin Anesthesia: General  Findings:  . Specimen: small bowel . EBL: 150cc . Drains/Implants: none  Disposition: PACU - hemodynamically stable.  Description of procedure: The patient was positioned supine on the operating room table. Time-out was performed verifying correct patient, procedure, signature of informed consent, and administration of pre-operative antibiotics. General anesthetic induction and intubation were uneventful. Foley catheter insertion was performed and was atraumatic.   A Hassan port was inserted under direct visualization at the umbilicus. Two additional 38mm ports were placed in the left abdomen. The ligament of Treitz was identified and the bowel run until a clear segment of volvulized mesentery was identified. This was unable to be reduced laparascopically and the procedure was converted to a midline laparotomy. The bowel was eviscerated and a segment of small bowel was found to be adhesed to the vaginal cuff and volvulized on its axis. This segment of bowel was freed, but this area of adhesed bowel and an approximately 40 cm segment of adjacent bowel had questionable viability. This segment was resected and a primary stapled anastomosis created with a widely patent common channel. The bowel was reduced back in to the abdomen and the mesentery confirmed to be flat. The abdomen was copiously irrigated and the fluid returned clear. The midline fascia was closed with a #1 looped PDS in a running  fashion. The skin was left open and a wound vac applied.   All sponge and instrument counts were correct at the conclusion of the procedure. The patient was awakened from anesthesia, extubated uneventfully, and transported to the PACU in good condition. There were no complications.    Jesusita Oka, MD General and [facility]

## 2019-03-30 NOTE — ED Notes (Signed)
Urine culture sent down with urine. 

## 2019-03-30 NOTE — H&P (Signed)
Reason for Consult: volvulus Referring Physician: Roxanne Mins, MD  Rebecca Lane is an 38 y.o. female.   HPI: 56F s/p hysterectomy 03/24/2019 by Dr. Corinna Capra at [facility] presents today with worsening abdominal pain and new n/v within the last 24h. She initially thought her pain was post-op pain, but presented after repeatedly vomiting her pain meds. She reports multiple episodes of breaking out in sweats and chills   Past Medical History:  Diagnosis Date   Allergy    Anemia    Anxiety    Depression    Diverticulitis    Heart murmur    Hx of ovarian cyst    Hx of varicella    Preeclampsia    Uterine polyp     Past Surgical History:  Procedure Laterality Date   DILATION AND CURETTAGE OF UTERUS     WISDOM TOOTH EXTRACTION      Family History  Problem Relation Age of Onset   Hypertension Mother    Osteoporosis Mother    Stroke Maternal Grandmother    Hypertension Maternal Grandmother    Diabetes Maternal Grandmother    Thrombophlebitis Paternal Grandfather    Diabetes Other        niece    Social History:  reports that she quit smoking about 10 years ago. She has never used smokeless tobacco. She reports current alcohol use of about 1.0 standard drinks of alcohol per week. She reports that she does not use drugs.  Allergies:  Allergies  Allergen Reactions   Penicillins Other (See Comments)    Reaction:  Unknown--childhood reaction  Has patient had a PCN reaction causing immediate rash, facial/tongue/throat swelling, SOB or lightheadedness with hypotension: unknown Has patient had a PCN reaction causing severe rash involving mucus membranes or skin necrosis: unknown Has patient had a PCN reaction that required hospitalization : unknown Has patient had a PCN reaction occurring within the last 10 years: no If all answers are "NO", then may proceed with Cephalosporin use.     Medications: I have reviewed the patient's current  medications.  Results for orders placed or performed during the hospital encounter of 03/29/19 (from the past 48 hour(s))  Comprehensive metabolic panel     Status: Abnormal   Collection Time: 03/29/19 11:57 PM  Result Value Ref Range   Sodium 137 135 - 145 mmol/L   Potassium 3.8 3.5 - 5.1 mmol/L   Chloride 96 (L) 98 - 111 mmol/L   CO2 20 (L) 22 - 32 mmol/L   Glucose, Bld 137 (H) 70 - 99 mg/dL   BUN 6 6 - 20 mg/dL   Creatinine, Ser 0.80 0.44 - 1.00 mg/dL   Calcium 10.1 8.9 - 10.3 mg/dL   Total Protein 8.9 (H) 6.5 - 8.1 g/dL   Albumin 4.4 3.5 - 5.0 g/dL   AST 20 15 - 41 U/L   ALT 15 0 - 44 U/L   Alkaline Phosphatase 44 38 - 126 U/L   Total Bilirubin 1.6 (H) 0.3 - 1.2 mg/dL   GFR calc non Af Amer >60 >60 mL/min   GFR calc Af Amer >60 >60 mL/min   Anion gap 21 (H) 5 - 15    Comment: Performed at [facility], [address], [place], [place] [address]  Lipase, blood     Status: None   Collection Time: 03/29/19 11:57 PM  Result Value Ref Range   Lipase 20 11 - 51 U/L    Comment: Performed at [facility], [place] [address], [place], [place] [address]  CBC with Differential     Status: Abnormal   Collection Time: 03/29/19 11:57 PM  Result Value Ref Range   WBC 10.7 (H) 4.0 - 10.5 K/uL   RBC 3.89 3.87 - 5.11 MIL/uL   Hemoglobin 11.4 (L) 12.0 - 15.0 g/dL   HCT 34.6 (L) 36.0 - 46.0 %   MCV 88.9 80.0 - 100.0 fL   MCH 29.3 26.0 - 34.0 pg   MCHC 32.9 30.0 - 36.0 g/dL   RDW 11.9 11.5 - 15.5 %   Platelets 334 150 - 400 K/uL   nRBC 0.0 0.0 - 0.2 %   Neutrophils Relative % 86 %   Neutro Abs 9.1 (H) 1.7 - 7.7 K/uL   Lymphocytes Relative 8 %   Lymphs Abs 0.9 0.7 - 4.0 K/uL   Monocytes Relative 6 %   Monocytes Absolute 0.6 0.1 - 1.0 K/uL   Eosinophils Relative 0 %   Eosinophils Absolute 0.0 0.0 - 0.5 K/uL   Basophils Relative 0 %   Basophils Absolute 0.0 0.0 - 0.1 K/uL   Immature Granulocytes 0 %   Abs Immature Granulocytes 0.04 0.00 - 0.07 K/uL    Comment: Performed at  [facility] [address], [place], [place] [address]  Lactic acid, plasma     Status: Abnormal   Collection Time: 03/29/19 11:57 PM  Result Value Ref Range   Lactic Acid, Venous 3.3 (HH) 0.5 - 1.9 mmol/L    Comment: CRITICAL RESULT CALLED TO, READ BACK BY AND VERIFIED WITH: RN P JOHNSON @0122  03/30/19 BY S GEZAHEGN Performed at [facility], [place] [address] [place], [place] [address]   Urinalysis, Routine w reflex microscopic     Status: Abnormal   Collection Time: 03/30/19  3:10 AM  Result Value Ref Range   Color, Urine YELLOW YELLOW   APPearance HAZY (A) CLEAR   Specific Gravity, Urine 1.027 1.005 - 1.030   pH 5.0 5.0 - 8.0   Glucose, UA NEGATIVE NEGATIVE mg/dL   Hgb urine dipstick LARGE (A) NEGATIVE   Bilirubin Urine NEGATIVE NEGATIVE   Ketones, ur 80 (A) NEGATIVE mg/dL   Protein, ur 30 (A) NEGATIVE mg/dL   Nitrite NEGATIVE NEGATIVE   Leukocytes,Ua TRACE (A) NEGATIVE   RBC / HPF 6-10 0 - 5 RBC/hpf   WBC, UA 0-5 0 - 5 WBC/hpf   Bacteria, UA RARE (A) NONE SEEN   Squamous Epithelial / LPF 0-5 0 - 5   Mucus PRESENT    Hyaline Casts, UA PRESENT     Comment: Performed at [facility], [place] [address] [place], [place] [address]    Ct Abdomen Pelvis W Contrast  Result Date: 03/30/2019 CLINICAL DATA:  Post hysterectomy.  Abdominal pain, nausea, vomiting EXAM: CT ABDOMEN AND PELVIS WITH CONTRAST TECHNIQUE: Multidetector CT imaging of the abdomen and pelvis was performed using the standard protocol following bolus administration of intravenous contrast. CONTRAST:  154mL OMNIPAQUE IOHEXOL 300 MG/ML  SOLN COMPARISON:  04/03/2016 FINDINGS: Lower chest: Lung bases are clear. No effusions. Heart is normal size. Hepatobiliary: No focal hepatic abnormality. Gallbladder unremarkable. Pancreas: No focal abnormality or ductal dilatation. Spleen: No focal abnormality.  Normal size. Adrenals/Urinary Tract: No adrenal abnormality. No focal renal abnormality. No stones or  hydronephrosis. Urinary bladder is unremarkable. Stomach/Bowel: Dilated fluid-filled small bowel loops noted in the abdomen and extending into the pelvis. Large bowel is decompressed. Distal small bowel loops are  decompressed. There is a swirling appearance of the small bowel loops and mesenteric vessels in the pelvis concerning for volvulus. Vascular/Lymphatic: No evidence of aneurysm or adenopathy. Reproductive: Prior hysterectomy. Complex fluid/soft tissue noted in the pelvis, likely postoperative hematoma. Other: No free air. Musculoskeletal: No acute bony abnormality. IMPRESSION: Dilated fluid-filled small bowel loops into the pelvis where there is a swirled appearance. Appearance is concerning for volvulus and associated small bowel obstruction. Complex fluid/soft tissue in the pelvis status post recent hysterectomy. This likely reflects postoperative hematoma. Electronically Signed   By: Rolm Baptise M.D.   On: 03/30/2019 02:03    ROS 10 point review of systems is negative except as listed above in HPI.   Physical Exam Blood pressure 121/89, temperature 98.8 F (37.1 C), temperature source Oral, resp. rate (!) 21, height 5\' 8"  (1.727 m), weight 73.9 kg. Physical Exam Gen: appears mildly uncomfortable, no acutedistress Neuro: non-focal exam HEENT: PERRL Neck: supple CV: RRR Pulm: unlabored breathing Abd: soft, tender in the lower abdomen  GU: clear yellow urine Extr: wwp, no edema    Assessment/Plan: 73F POD6 from lap-assisted vag hyst, now with imaging findings and clinical picture c/w volvulus.   To OR emergently for dx laparoscopy, possible exlap, possible SB resection. Patient and mother at bedside appropriately worried, asking appropriate questions, all of which were answered. Mom, Beulah Gandy, can be reached via phone at [phone].  Jesusita Oka, MD General and [facility]

## 2019-03-30 NOTE — Transfer of Care (Signed)
Immediate Anesthesia Transfer of Care Note  Patient: Rebecca Lane  Procedure(s) Performed: LAPAROSCOPY DIAGNOSTIC, EXPLORATORY LAPAROTOMY, SMALL BOWEL RESECTION (N/A Abdomen)  Patient Location: PACU  Anesthesia Type:General  Level of Consciousness: sedated  Airway & Oxygen Therapy: Patient Spontanous Breathing and Patient connected to nasal cannula oxygen  Post-op Assessment: Report given to RN, Post -op Vital signs reviewed and stable and Patient moving all extremities X 4  Post vital signs: Reviewed and stable  Last Vitals:  Vitals Value Taken Time  BP 140/93 03/30/19 0810  Temp    Pulse    Resp 18 03/30/19 0811  SpO2    Vitals shown include unvalidated device data.  Last Pain:  Vitals:   03/30/19 0014  TempSrc: Oral  PainSc:          Complications: No apparent anesthesia complications

## 2019-03-30 NOTE — Anesthesia Preprocedure Evaluation (Signed)
Anesthesia Evaluation  Patient identified by MRN, date of birth, ID band  Reviewed: Unable to perform ROS - Chart review onlyPreop documentation limited or incomplete due to emergent nature of procedure.  History of Anesthesia Complications Negative for: history of anesthetic complications  Airway        Dental  (+) Dental Advisory Given   Pulmonary former smoker,    breath sounds clear to auscultation       Cardiovascular hypertension,  Rhythm:Regular Rate:Tachycardia     Neuro/Psych    GI/Hepatic   Endo/Other    Renal/GU      Musculoskeletal   Abdominal   Peds  Hematology  (+) anemia ,   Anesthesia Other Findings   Reproductive/Obstetrics                             Anesthesia Physical Anesthesia Plan  ASA: II  Anesthesia Plan: General   Post-op Pain Management:    Induction: Intravenous, Rapid sequence and Cricoid pressure planned  PONV Risk Score and Plan: 3 and Ondansetron and Dexamethasone  Airway Management Planned: Oral ETT  Additional Equipment: None  Intra-op Plan:   Post-operative Plan: Extubation in OR and Possible Post-op intubation/ventilation  Informed Consent:     History available from chart only and Only emergency history available  Plan Discussed with: CRNA and Surgeon  Anesthesia Plan Comments:         Anesthesia Quick Evaluation

## 2019-03-30 NOTE — ED Notes (Signed)
Dr Roxanne Mins in with pt will return for lab draw

## 2019-03-30 NOTE — Anesthesia Procedure Notes (Signed)
Procedure Name: Intubation Date/Time: 03/30/2019 5:24 AM Performed by: Jearld Pies, CRNA Pre-anesthesia Checklist: Patient identified, Emergency Drugs available, Suction available and Patient being monitored Patient Re-evaluated:Patient Re-evaluated prior to induction Oxygen Delivery Method: Circle System Utilized Preoxygenation: Pre-oxygenation with 100% oxygen Induction Type: IV induction, Rapid sequence and Cricoid Pressure applied Laryngoscope Size: Mac and 3 Grade View: Grade I Tube type: Oral Tube size: 7.5 mm Number of attempts: 1 Airway Equipment and Method: Stylet Placement Confirmation: ETT inserted through vocal cords under direct vision,  positive ETCO2 and breath sounds checked- equal and bilateral Secured at: 22 cm Tube secured with: Tape Dental Injury: Teeth and Oropharynx as per pre-operative assessment

## 2019-03-30 NOTE — ED Provider Notes (Signed)
[facility] EMERGENCY DEPARTMENT Provider Note   CSN: [id number] Arrival date & time: 03/29/19  2323    History   Chief Complaint Chief Complaint  Patient presents with   Abdominal Pain    HPI Rebecca Lane is a 38 y.o. female.   The history is provided by the patient.  Abdominal Pain She is 5 days postoperative hysterectomy for excessive bleeding and comes in with severe lower abdominal pain, nausea, vomiting.  She had been having postoperative pain and some chills ever since the surgery.  Pain had been controlled with oxycodone-acetaminophen.  Today, she started having nausea and vomiting and is been unable to hold the narcotic pain medication down.  Along with severe pain, she has broken out in sweats and can has continued to have chills.  She has not had any fever that she is aware of.  She denies any urinary symptoms.  Pain is rated at 10/10.  Nothing makes it better, nothing makes it worse.  Past Medical History:  Diagnosis Date   Allergy    Anemia    Anxiety    Depression    Diverticulitis    Heart murmur    Hx of ovarian cyst    Hx of varicella    Preeclampsia    Uterine polyp     There are no active problems to display for this patient.   Past Surgical History:  Procedure Laterality Date   DILATION AND CURETTAGE OF UTERUS     WISDOM TOOTH EXTRACTION       OB History    Gravida  2   Para  2   Term  2   Preterm      AB      Living  2     SAB      TAB      Ectopic      Multiple      Live Births  2            Home Medications    Prior to Admission medications   Medication Sig Start Date End Date Taking? Authorizing Provider  acetaminophen (TYLENOL) 500 MG tablet Take 1,000 mg by mouth every 6 (six) hours as needed (pain).    [provider]  Carbonyl Iron (FEOSOL PO) Take 1 tablet by mouth daily.     [provider]  Docusate Calcium (STOOL SOFTENER PO) Take by mouth daily as  needed.    [provider]  oxyCODONE (ROXICODONE) 5 MG immediate release tablet Take 1 tablet (5 mg total) by mouth every 4 (four) hours as needed for severe pain. 06/20/18   Ward, Ozella Almond, PA-C    Family History Family History  Problem Relation Age of Onset   Hypertension Mother    Osteoporosis Mother    Stroke Maternal Grandmother    Hypertension Maternal Grandmother    Diabetes Maternal Grandmother    Thrombophlebitis Paternal Grandfather    Diabetes Other        niece    Social History Social History   Tobacco Use   Smoking status: Former Smoker    Quit date: 04/08/2008    Years since quitting: 10.9   Smokeless tobacco: Never Used  Substance Use Topics   Alcohol use: Yes    Alcohol/week: 1.0 standard drinks    Types: 1 Standard drinks or equivalent per week    Comment: occational   Drug use: No     Allergies   Penicillins  Review of Systems Review of Systems  Gastrointestinal: Positive for abdominal pain.  All other systems reviewed and are negative.    Physical Exam Updated Vital Signs BP 121/89    Temp 98.8 F (37.1 C) (Oral)    Resp (!) 21    Ht 5\' 8"  (1.727 m)    Wt 73.9 kg    BMI 24.78 kg/m   Physical Exam Vitals signs and nursing note reviewed.    38 year old female, in obvious pain. Vital signs are significant for borderline elevated respiratory rate. Oxygen saturation is 99%, which is normal. Head is normocephalic and atraumatic. PERRLA, EOMI. Oropharynx is clear. Neck is nontender and supple without adenopathy or JVD. Back is nontender and there is no CVA tenderness. Lungs are clear without rales, wheezes, or rhonchi. Chest is nontender. Heart has regular rate and rhythm without murmur. Abdomen is soft, flat, with marked tenderness across the lower abdomen.  There is voluntary guarding but no rebound.  There are no masses or hepatosplenomegaly and peristalsis is hypoactive. Extremities have no cyanosis or edema,  full range of motion is present. Skin is warm and dry without rash. Neurologic: Mental status is normal, cranial nerves are intact, there are no motor or sensory deficits.  ED Treatments / Results  Labs (all labs ordered are listed, but only abnormal results are displayed) Labs Reviewed  COMPREHENSIVE METABOLIC PANEL - Abnormal; Notable for the following components:      Result Value   Chloride 96 (*)    CO2 20 (*)    Glucose, Bld 137 (*)    Total Protein 8.9 (*)    Total Bilirubin 1.6 (*)    Anion gap 21 (*)    All other components within normal limits  CBC WITH DIFFERENTIAL/PLATELET - Abnormal; Notable for the following components:   WBC 10.7 (*)    Hemoglobin 11.4 (*)    HCT 34.6 (*)    Neutro Abs 9.1 (*)    All other components within normal limits  LACTIC ACID, PLASMA - Abnormal; Notable for the following components:   Lactic Acid, Venous 3.3 (*)    All other components within normal limits  URINALYSIS, ROUTINE W REFLEX MICROSCOPIC - Abnormal; Notable for the following components:   APPearance HAZY (*)    Hgb urine dipstick LARGE (*)    Ketones, ur 80 (*)    Protein, ur 30 (*)    Leukocytes,Ua TRACE (*)    Bacteria, UA RARE (*)    All other components within normal limits  SARS CORONAVIRUS 2 BY RT PCR (HOSPITAL ORDER, [facility])  LIPASE, BLOOD  LACTIC ACID, PLASMA  PROTIME-INR  APTT  TYPE AND SCREEN    EKG EKG Interpretation  Date/Time:  Monday March 29 2019 23:32:05 EST Ventricular Rate:  122 PR Interval:    QRS Duration: 86 QT Interval:  326 QTC Calculation: 465 R Axis:   56 Text Interpretation: Age not entered, assumed to be  38 years old for purpose of ECG interpretation Sinus tachycardia Anteroseptal infarct, old Nonspecific T abnormalities, lateral leads Baseline wander in lead(s) V1 No old tracing to compare Confirmed by Delora Fuel ([id number]) on 03/30/2019 12:01:49 AM   Radiology Ct Abdomen Pelvis W Contrast  Result  Date: 03/30/2019 CLINICAL DATA:  Post hysterectomy.  Abdominal pain, nausea, vomiting EXAM: CT ABDOMEN AND PELVIS WITH CONTRAST TECHNIQUE: Multidetector CT imaging of the abdomen and pelvis was performed using the standard protocol following bolus administration of intravenous contrast. CONTRAST:  156mL OMNIPAQUE IOHEXOL 300 MG/ML  SOLN COMPARISON:  04/03/2016 FINDINGS: Lower chest: Lung bases are clear. No effusions. Heart is normal size. Hepatobiliary: No focal hepatic abnormality. Gallbladder unremarkable. Pancreas: No focal abnormality or ductal dilatation. Spleen: No focal abnormality.  Normal size. Adrenals/Urinary Tract: No adrenal abnormality. No focal renal abnormality. No stones or hydronephrosis. Urinary bladder is unremarkable. Stomach/Bowel: Dilated fluid-filled small bowel loops noted in the abdomen and extending into the pelvis. Large bowel is decompressed. Distal small bowel loops are decompressed. There is a swirling appearance of the small bowel loops and mesenteric vessels in the pelvis concerning for volvulus. Vascular/Lymphatic: No evidence of aneurysm or adenopathy. Reproductive: Prior hysterectomy. Complex fluid/soft tissue noted in the pelvis, likely postoperative hematoma. Other: No free air. Musculoskeletal: No acute bony abnormality. IMPRESSION: Dilated fluid-filled small bowel loops into the pelvis where there is a swirled appearance. Appearance is concerning for volvulus and associated small bowel obstruction. Complex fluid/soft tissue in the pelvis status post recent hysterectomy. This likely reflects postoperative hematoma. Electronically Signed   By: Rolm Baptise M.D.   On: 03/30/2019 02:03    Procedures Procedures   Medications Ordered in ED Medications  HYDROmorphone (DILAUDID) injection 1 mg (has no administration in time range)  sodium chloride 0.9 % bolus 1,000 mL (0 mLs Intravenous Stopped 03/30/19 0403)  ondansetron (ZOFRAN) injection 4 mg (4 mg Intravenous Given  03/30/19 0018)  HYDROmorphone (DILAUDID) injection 1 mg (1 mg Intravenous Given 03/30/19 0015)  iohexol (OMNIPAQUE) 300 MG/ML solution 100 mL (100 mLs Intravenous Contrast Given 03/30/19 0134)     Initial Impression / Assessment and Plan / ED Course  I have reviewed the triage vital signs and the nursing notes.  Pertinent labs & imaging results that were available during my care of the patient were reviewed by me and considered in my medical decision making (see chart for details).  Abdominal pain with nausea and vomiting status post recent hysterectomy.  This appears to be more than just routine postoperative pain.  She will be given IV fluids, hydromorphone, ondansetron and will be sent for CT of abdomen and pelvis.  Unfortunately, on review of old records, I am unable to see her surgical record or her gynecologist's office notes.  Labs show anemia which is actually slightly improved over baseline.  Lactic acid level is mildly elevated.  This is felt to be due to an acute intra-abdominal process and not due to sepsis.  She is given IV fluids.  Remainder of labs are unremarkable.  Bilirubin is minimally elevated but with normal transaminases and alkaline phosphatase.  CT of abdomen and pelvis shows small bowel obstruction with probable volvulus.  Also pelvic hematoma secondary to recent surgery.  She did get good relief of pain with hydromorphone and required a second injection.  Case is discussed with Dr. Bobbye Morton, on-call for general surgery, who agrees to come to evaluate the patient for urgent surgical management.  Case also discussed with Dr. Gaetano Net, on-call for her GYN surgeon [place].  Final Clinical Impressions(s) / ED Diagnoses   Final diagnoses:  Volvulus ([place])  Small bowel obstruction ([facility])  Elevated lactic acid level  Normochromic normocytic anemia    ED Discharge Orders    None       Delora Fuel, MD [id number] [phone]

## 2019-03-31 ENCOUNTER — Encounter (HOSPITAL_COMMUNITY): Payer: Self-pay | Admitting: Surgery

## 2019-03-31 LAB — MRSA PCR SCREENING: MRSA by PCR: NEGATIVE

## 2019-03-31 LAB — BASIC METABOLIC PANEL
Anion gap: 7 (ref 5–15)
BUN: <5 mg/dL — ABNORMAL LOW (ref 6–20)
CO2: 24 mmol/L (ref 22–32)
Calcium: 8 mg/dL — ABNORMAL LOW (ref 8.9–10.3)
Chloride: 105 mmol/L (ref 98–111)
Creatinine, Ser: 0.45 mg/dL (ref 0.44–1.00)
GFR calc Af Amer: >60 mL/min (ref >60)
GFR calc non Af Amer: >60 mL/min (ref >60)
Glucose, Bld: 190 mg/dL — ABNORMAL HIGH (ref 70–99)
Potassium: 3.4 mmol/L — ABNORMAL LOW (ref 3.5–5.1)
Sodium: 136 mmol/L (ref 135–145)

## 2019-03-31 LAB — CBC
HCT: 29.6 % — ABNORMAL LOW (ref 36.0–46.0)
Hemoglobin: 9.8 g/dL — ABNORMAL LOW (ref 12.0–15.0)
MCH: 28.9 pg (ref 26.0–34.0)
MCHC: 33.1 g/dL (ref 30.0–36.0)
MCV: 87.3 fL (ref 80.0–100.0)
Platelets: 292 10*3/uL (ref 150–400)
RBC: 3.39 MIL/uL — ABNORMAL LOW (ref 3.87–5.11)
RDW: 13.1 % (ref 11.5–15.5)
WBC: 9.4 10*3/uL (ref 4.0–10.5)
nRBC: 0 % (ref 0.0–0.2)

## 2019-03-31 LAB — PHOSPHORUS: Phosphorus: 2 mg/dL — ABNORMAL LOW (ref 2.5–4.6)

## 2019-03-31 LAB — HIV ANTIBODY (ROUTINE TESTING W REFLEX): HIV Screen 4th Generation wRfx: NONREACTIVE — AB

## 2019-03-31 LAB — MAGNESIUM: Magnesium: 1.5 mg/dL — ABNORMAL LOW (ref 1.7–2.4)

## 2019-03-31 MED ORDER — SODIUM CHLORIDE 0.9% FLUSH: 10.0000 mL | INTRAVENOUS | Status: DC | PRN

## 2019-03-31 MED ORDER — POTASSIUM CHLORIDE 10 MEQ/100ML IV SOLN
10.0000 meq | INTRAVENOUS | Status: AC
  Administered 2019-03-31 (×2): 10 meq via INTRAVENOUS
  Filled 2019-03-31 (×2): qty 100

## 2019-03-31 MED ORDER — LORAZEPAM 2 MG/ML IJ SOLN
1.0000 mg | Freq: Three times a day (TID) | INTRAMUSCULAR | Status: DC | PRN
  Administered 2019-03-31: 1 mg via INTRAVENOUS
  Filled 2019-03-31: qty 1

## 2019-03-31 MED ORDER — SODIUM CHLORIDE 0.9% FLUSH
10.0000 mL | Freq: Two times a day (BID) | INTRAVENOUS | Status: DC
  Administered 2019-03-31 – 2019-04-03 (×6): 10 mL

## 2019-03-31 MED ORDER — SODIUM CHLORIDE 0.9 % IV SOLN
6.0000 g | Freq: Once | INTRAVENOUS | Status: AC
  Administered 2019-03-31: 6 g via INTRAVENOUS
  Filled 2019-03-31: qty 12

## 2019-03-31 MED ORDER — MENTHOL 3 MG MT LOZG: 1.0000 | LOZENGE | OROMUCOSAL | Status: DC | PRN

## 2019-03-31 MED ORDER — SODIUM PHOSPHATES 45 MMOLE/15ML IV SOLN
10.0000 mmol | Freq: Once | INTRAVENOUS | Status: AC
  Administered 2019-03-31: 10 mmol via INTRAVENOUS
  Filled 2019-03-31: qty 3.33

## 2019-03-31 MED ORDER — PANTOPRAZOLE SODIUM 40 MG IV SOLR
40.0000 mg | Freq: Every day | INTRAVENOUS | Status: DC
  Administered 2019-03-31 – 2019-04-03 (×4): 40 mg via INTRAVENOUS
  Filled 2019-03-31 (×4): qty 40

## 2019-03-31 MED ORDER — KETOROLAC TROMETHAMINE 30 MG/ML IJ SOLN
30.0000 mg | Freq: Four times a day (QID) | INTRAMUSCULAR | Status: DC | PRN
  Administered 2019-03-31 – 2019-04-02 (×6): 30 mg via INTRAVENOUS
  Filled 2019-03-31 (×7): qty 1

## 2019-03-31 NOTE — Evaluation (Signed)
Physical Therapy Evaluation Patient Details Name: Rebecca Lane MRN: [id number] DOB: May 12, 1981 Today's Date: 03/31/2019   History of Present Illness  Pt is a 38 yo female with worsening abdominal pain s/p exploratory laparotomy, small bowel resection. PMHx: HTN, hysterectomy.    Clinical Impression  Pt admitted with/for complications from previous surgery, s/p exp lap. With small bowel resection.  Pt needing up to minimal assist overall with min guard at times.  Pt likely will not need PT follow up, but will revisit as she progresses.  Pt currently limited functionally due to the problems listed. ( See problems list.)   Pt will benefit from PT to maximize function and safety in order to get ready for next venue listed below.     Follow Up Recommendations No PT follow up;Supervision/Assistance - 24 hour    Equipment Recommendations  None recommended by PT    Recommendations for Other Services       Precautions / Restrictions Precautions Precautions: Fall Restrictions Weight Bearing Restrictions: No      Mobility  Bed Mobility Overal bed mobility: Needs Assistance Bed Mobility: Rolling;Sidelying to Sit Rolling: Min assist Sidelying to sit: Min assist       General bed mobility comments: Pt working with PT and pt began OT session  Transfers Overall transfer level: Needs assistance Equipment used: None Transfers: Sit to/from Stand Sit to Stand: Min guard            Ambulation/Gait Ambulation/Gait assistance: Min guard Gait Distance (Feet): 300 Feet Assistive device: None Gait Pattern/deviations: Step-through pattern   Gait velocity interpretation: <1.8 ft/sec, indicate of risk for recurrent falls General Gait Details: slow and guarded, but basically steady.  Stairs            Wheelchair Mobility    Modified Rankin (Stroke Patients Only)       Balance Overall balance assessment: No apparent balance deficits (not formally assessed)                                            Pertinent Vitals/Pain Pain Assessment: Faces Pain Score: 4  Faces Pain Scale: Hurts little more Pain Location: abdominal incisions with positional changes Pain Descriptors / Indicators: Grimacing;Guarding Pain Intervention(s): Monitored during session    Home Living Family/patient expects to be discharged to:: Private residence Living Arrangements: Spouse/significant other;Children Available Help at Discharge: Family;Available 24 hours/day Type of Home: House Home Access: Stairs to enter Entrance Stairs-Rails: Psychiatric nurse of Steps: several Home Layout: Two level;Bed/bath upstairs Home Equipment: None      Prior Function Level of Independence: Independent         Comments: Sits at computer at work     Hand Dominance   Dominant Hand: Right    Extremity/Trunk Assessment   Upper Extremity Assessment Upper Extremity Assessment: Overall WFL for tasks assessed    Lower Extremity Assessment Lower Extremity Assessment: Overall WFL for tasks assessed    Cervical / Trunk Assessment Cervical / Trunk Assessment: Normal  Communication   Communication: No difficulties  Cognition Arousal/Alertness: Awake/alert Behavior During Therapy: WFL for tasks assessed/performed Overall Cognitive Status: Within Functional Limits for tasks assessed                                        General  Comments General comments (skin integrity, edema, etc.): HR 122 BPM with exertion;     Exercises     Assessment/Plan    PT Assessment Patient needs continued PT services  PT Problem List Decreased strength;Decreased activity tolerance;Decreased mobility;Pain       PT Treatment Interventions Gait training;Stair training;Functional mobility training;Therapeutic activities;Patient/family education    PT Goals (Current goals can be found in the Care Plan section)  Acute Rehab PT Goals Patient Stated  Goal: back independent, at work PT Goal Formulation: With patient Time For Goal Achievement: 04/14/19 Potential to Achieve Goals: Good    Frequency Min 3X/week   Barriers to discharge        Co-evaluation               AM-PAC PT "6 Clicks" Mobility  Outcome Measure Help needed turning from your back to your side while in a flat bed without using bedrails?: A Little Help needed moving from lying on your back to sitting on the side of a flat bed without using bedrails?: A Little Help needed moving to and from a bed to a chair (including a wheelchair)?: A Little Help needed standing up from a chair using your arms (e.g., wheelchair or bedside chair)?: A Little Help needed to walk in hospital room?: A Little Help needed climbing 3-5 steps with a railing? : A Little 6 Click Score: 18    End of Session   Activity Tolerance: Patient tolerated treatment well Patient left: Other (comment)(left with OT for eval) Nurse Communication: Mobility status PT Visit Diagnosis: Other abnormalities of gait and mobility (R26.89);Pain Pain - part of body: (abdoment)    Time: [id number] PT Time Calculation (min) (ACUTE ONLY): 29 min   Charges:   PT Evaluation $PT Eval Moderate Complexity: 1 Mod PT Treatments $Gait Training: 8-22 mins        03/31/2019  Donnella Sham, PT Acute Rehabilitation Services [phone]  (pager) [phone]  (office)  Tessie Fass Derius Ghosh 03/31/2019, 4:58 PM

## 2019-03-31 NOTE — Evaluation (Signed)
Occupational Therapy Evaluation Patient Details Name: Rebecca Lane MRN: [id number] DOB: 01-Nov-1980 Today's Date: 03/31/2019    History of Present Illness Pt is a 38 yo female with worsening abdominal pain s/p exploratory laparotomy, small bowel resection. PMHx: HTN, hysterectomy.   Clinical Impression   Pt PTA: living with family, independent with ADL and mobility; working. Pt with supportive spouse present for session. Pt currently limited by pain in abdomen, decreased ability to perform ADL and decreased activity tolerance. Pt currently requiring minA for UB and modA for LB ADL due to discomfort.AE education required prior to d/c home. Pt supervisionA for lines with mobility. Pt would benefit from continued OT skilled services for ADL, mobility and safety acutely.      Follow Up Recommendations  Follow surgeon's recommendation for DC plan and follow-up therapies;Supervision/Assistance - 24 hour(initially)    Equipment Recommendations  None recommended by OT    Recommendations for Other Services       Precautions / Restrictions Precautions Precautions: Fall Restrictions Weight Bearing Restrictions: No      Mobility Bed Mobility Overal bed mobility: Needs Assistance Bed Mobility: Rolling;Sidelying to Sit Rolling: Min assist Sidelying to sit: Min assist       General bed mobility comments: Pt working with PT and pt began OT session  Transfers Overall transfer level: Needs assistance Equipment used: None Transfers: Sit to/from Stand Sit to Stand: Min guard              Balance Overall balance assessment: No apparent balance deficits (not formally assessed)                                         ADL either performed or assessed with clinical judgement   ADL Overall ADL's : Needs assistance/impaired Eating/Feeding: Modified independent;Sitting   Grooming: Min guard;Standing   Upper Body Bathing: Set up;Sitting   Lower Body Bathing:  Moderate assistance;Sitting/lateral leans;Sit to/from stand   Upper Body Dressing : Set up;Sitting   Lower Body Dressing: Moderate assistance;Sitting/lateral leans;Sit to/from stand   Toilet Transfer: Designer, fashion/clothing and Hygiene: Minimal assistance;Sitting/lateral lean;Sit to/from stand       Functional mobility during ADLs: Supervision/safety;Cueing for safety;+2 for safety/equipment General ADL Comments: Pt limited by pain in abdomen, decreased ability to perform ADL and increased pain,     Vision Baseline Vision/History: No visual deficits Vision Assessment?: No apparent visual deficits     Perception     Praxis      Pertinent Vitals/Pain Pain Assessment: Faces Pain Score: 4  Faces Pain Scale: Hurts little more Pain Location: abdominal incisions with positional changes Pain Descriptors / Indicators: Grimacing;Guarding Pain Intervention(s): Monitored during session     Hand Dominance Right   Extremity/Trunk Assessment Upper Extremity Assessment Upper Extremity Assessment: Overall WFL for tasks assessed   Lower Extremity Assessment Lower Extremity Assessment: Overall WFL for tasks assessed   Cervical / Trunk Assessment Cervical / Trunk Assessment: Normal   Communication Communication Communication: No difficulties   Cognition Arousal/Alertness: Awake/alert Behavior During Therapy: WFL for tasks assessed/performed Overall Cognitive Status: Within Functional Limits for tasks assessed                                     General Comments  HR 122 BPM with exertion;  Exercises     Shoulder Instructions      Home Living Family/patient expects to be discharged to:: Private residence Living Arrangements: Spouse/significant other;Children Available Help at Discharge: Family;Available 24 hours/day Type of Home: House Home Access: Stairs to enter [employer] of Steps: several Entrance Stairs-Rails:  Right;Left Home Layout: Two level;Bed/bath upstairs Alternate Level Stairs-Number of Steps: 12 Alternate Level Stairs-Rails: Right;Left Bathroom Shower/Tub: Tub/shower unit;Curtain   Bathroom Toilet: Standard     Home Equipment: None          Prior Functioning/Environment Level of Independence: Independent        Comments: Sits at computer at work        OT Problem List: Decreased strength;Decreased activity tolerance;Impaired balance (sitting and/or standing);Decreased coordination;Decreased safety awareness;Decreased knowledge of use of DME or AE;Impaired UE functional use;Pain      OT Treatment/Interventions: Self-care/ADL training;Therapeutic exercise;Neuromuscular education;Energy conservation;Therapeutic activities;DME and/or AE instruction;Balance training;Patient/family education    OT Goals(Current goals can be found in the care plan section) Acute Rehab OT Goals Patient Stated Goal: back independent, at work OT Goal Formulation: With patient Time For Goal Achievement: 04/14/19 Potential to Achieve Goals: Good ADL Goals Pt Will Perform Lower Body Dressing: with set-up;sit to/from stand;with adaptive equipment Pt Will Perform Toileting - Clothing Manipulation and hygiene: with set-up;sitting/lateral leans;with adaptive equipment Additional ADL Goal #1: Pt will increase to supervisionA level for ADL  OT Frequency: Min 2X/week   Barriers to D/C:            Co-evaluation              AM-PAC OT "6 Clicks" Daily Activity     Outcome Measure Help from another person eating meals?: None Help from another person taking care of personal grooming?: A Little Help from another person toileting, which includes using toliet, bedpan, or urinal?: A Little Help from another person bathing (including washing, rinsing, drying)?: A Little Help from another person to put on and taking off regular upper body clothing?: A Little Help from another person to put on and  taking off regular lower body clothing?: A Little 6 Click Score: 19   End of Session Nurse Communication: Mobility status  Activity Tolerance: Patient tolerated treatment well Patient left: Other (comment)(left with NT for bathing routine)  OT Visit Diagnosis: Unsteadiness on feet (R26.81);Muscle weakness (generalized) (M62.81);Pain Pain - part of body: (abdomen)                Time: [id number] OT Time Calculation (min): 21 min Charges:  OT General Charges $OT Visit: 1 Visit OT Evaluation $OT Eval Moderate Complexity: 1 Mod  Darryl Nestle) Marsa Aris OTR/L Acute Rehabilitation Services Pager: [phone] Office: [place] 03/31/2019, 4:36 PM

## 2019-03-31 NOTE — Progress Notes (Signed)
Patient complaining of bilateral chest pain that worsens with inspiration, nausea, chills, dizziness, and a pounding heart (tachy on tele). Patient appears to be having a panic attack. The oncall doctor ordered Ativan, which was promptly administered. Currently sitting in patient's room and helping to ease her anxiety. Will continue to monitor.

## 2019-03-31 NOTE — Progress Notes (Signed)
[facility]/Trauma Progress Note  1 Day Post-Op   Assessment/Plan  S/P 03/24/19 lap-assisted vag hysterectomy  volvulus secondary to small bowel adhesions to vaginal cuff - S/P  diagnostic laparoscopy converted to exploratory laparotomy, small bowel resection - continue NGT until flatus - DC foley - replace Mag, phos and K IV  FEN: NPO, NGT, replace electrolytes IV VTE: SCD's, lovenox ID: no abx, afebrile, WBC 9.4 Foley: DC today Follow up: Dr. Bobbye Morton  DISPO: await return of bowel function, ambulate.     LOS: 1 day    Subjective: CC: abdominal soreness  Pt states she had a panic attack last night. She is feeling better today. She wants to walk. She denies flatus or BM.   Objective: Vital signs in last 24 hours: Temp:  [97 F (36.1 C)-99.3 F (37.4 C)] 98.8 F (37.1 C) (11/18 0854) Pulse Rate:  [80-112] 104 (11/18 0854) Resp:  [11-22] 20 (11/18 0854) BP: (125-154)/(80-99) 131/87 (11/18 0854) SpO2:  [97 %-100 %] 97 % (11/18 0854) Last BM Date: (PTA)  Intake/Output from previous day: 11/17 0701 - 11/18 0700 In: 3102.9 [I.V.:2037.9; Blood:315; IV Piggyback:350] Out: 1125 [Urine:575; Emesis/NG output:250; Blood:300] Intake/Output this shift: Total I/O In: [id number] [I.V.:1282] Out: -   PE:  Gen:  Alert, NAD, pleasant, cooperative Card:  RRR, no M/G/R heard Pulm:  CTA, no W/R/R, effort normal Abd: Soft, ND, +BS, midline with vac, port sites C/D/I, mild generalized TTP, no peritonitis  Skin: no rashes noted, warm and dry   Anti-infectives: Anti-infectives (From admission, onward)   None      Lab Results:  Recent Labs    03/30/19 1812 03/31/19 0704  WBC 10.2 9.4  HGB 9.4* 9.8*  HCT 27.7* 29.6*  PLT 256 292   BMET Recent Labs    03/29/19 2357 03/30/19 0639 03/30/19 1812 03/31/19 0704  NA 137 136  --  136  K 3.8 3.6  --  3.4*  CL 96*  --   --  105  CO2 20*  --   --  24  GLUCOSE 137*  --   --  190*  BUN 6  --   --  <5*  CREATININE  0.80  --  0.58 0.45  CALCIUM 10.1  --   --  8.0*   PT/INR Recent Labs    03/30/19 0429  LABPROT 14.3  INR 1.1   CMP     Component Value Date/Time   NA 136 03/31/2019 0704   K 3.4 (L) 03/31/2019 0704   CL 105 03/31/2019 0704   CO2 24 03/31/2019 0704   GLUCOSE 190 (H) 03/31/2019 0704   BUN <5 (L) 03/31/2019 0704   CREATININE 0.45 03/31/2019 0704   CALCIUM 8.0 (L) 03/31/2019 0704   PROT 8.9 (H) 03/29/2019 2357   ALBUMIN 4.4 03/29/2019 2357   AST 20 03/29/2019 2357   ALT 15 03/29/2019 2357   ALKPHOS 44 03/29/2019 2357   BILITOT 1.6 (H) 03/29/2019 2357   GFRNONAA >60 03/31/2019 0704   GFRAA >60 03/31/2019 0704   Lipase     Component Value Date/Time   LIPASE 20 03/29/2019 2357    Studies/Results: Ct Abdomen Pelvis W Contrast  Result Date: 03/30/2019 CLINICAL DATA:  Post hysterectomy.  Abdominal pain, nausea, vomiting EXAM: CT ABDOMEN AND PELVIS WITH CONTRAST TECHNIQUE: Multidetector CT imaging of the abdomen and pelvis was performed using the standard protocol following bolus administration of intravenous contrast. CONTRAST:  173mL OMNIPAQUE IOHEXOL 300 MG/ML  SOLN COMPARISON:  04/03/2016 FINDINGS: Lower  chest: Lung bases are clear. No effusions. Heart is normal size. Hepatobiliary: No focal hepatic abnormality. Gallbladder unremarkable. Pancreas: No focal abnormality or ductal dilatation. Spleen: No focal abnormality.  Normal size. Adrenals/Urinary Tract: No adrenal abnormality. No focal renal abnormality. No stones or hydronephrosis. Urinary bladder is unremarkable. Stomach/Bowel: Dilated fluid-filled small bowel loops noted in the abdomen and extending into the pelvis. Large bowel is decompressed. Distal small bowel loops are decompressed. There is a swirling appearance of the small bowel loops and mesenteric vessels in the pelvis concerning for volvulus. Vascular/Lymphatic: No evidence of aneurysm or adenopathy. Reproductive: Prior hysterectomy. Complex fluid/soft tissue noted  in the pelvis, likely postoperative hematoma. Other: No free air. Musculoskeletal: No acute bony abnormality. IMPRESSION: Dilated fluid-filled small bowel loops into the pelvis where there is a swirled appearance. Appearance is concerning for volvulus and associated small bowel obstruction. Complex fluid/soft tissue in the pelvis status post recent hysterectomy. This likely reflects postoperative hematoma. Electronically Signed   By: Rolm Baptise M.D.   On: 03/30/2019 02:03     Kalman Drape, [facility] Surgery Please see amion for pager for the following: Cristine Polio, & Friday 7:00am - 4:30pm Thursdays 7:00am -11:30am

## 2019-04-01 LAB — BASIC METABOLIC PANEL
Anion gap: 9 (ref 5–15)
BUN: <5 mg/dL — ABNORMAL LOW (ref 6–20)
CO2: 22 mmol/L (ref 22–32)
Calcium: 7.9 mg/dL — ABNORMAL LOW (ref 8.9–10.3)
Chloride: 105 mmol/L (ref 98–111)
Creatinine, Ser: 0.48 mg/dL (ref 0.44–1.00)
GFR calc Af Amer: >60 mL/min (ref >60)
GFR calc non Af Amer: >60 mL/min (ref >60)
Glucose, Bld: 132 mg/dL — ABNORMAL HIGH (ref 70–99)
Potassium: 3.4 mmol/L — ABNORMAL LOW (ref 3.5–5.1)
Sodium: 136 mmol/L (ref 135–145)

## 2019-04-01 LAB — MAGNESIUM: Magnesium: 2.1 mg/dL (ref 1.7–2.4)

## 2019-04-01 LAB — CBC
HCT: 25.3 % — ABNORMAL LOW (ref 36.0–46.0)
Hemoglobin: 8.3 g/dL — ABNORMAL LOW (ref 12.0–15.0)
MCH: 28.9 pg (ref 26.0–34.0)
MCHC: 32.8 g/dL (ref 30.0–36.0)
MCV: 88.2 fL (ref 80.0–100.0)
Platelets: 274 10*3/uL (ref 150–400)
RBC: 2.87 MIL/uL — ABNORMAL LOW (ref 3.87–5.11)
RDW: 12.9 % (ref 11.5–15.5)
WBC: 7.6 10*3/uL (ref 4.0–10.5)
nRBC: 0 % (ref 0.0–0.2)

## 2019-04-01 LAB — PHOSPHORUS: Phosphorus: 2.2 mg/dL — ABNORMAL LOW (ref 2.5–4.6)

## 2019-04-01 LAB — SURGICAL PATHOLOGY

## 2019-04-01 MED ORDER — SODIUM PHOSPHATES 45 MMOLE/15ML IV SOLN
10.0000 mmol | Freq: Once | INTRAVENOUS | Status: AC
  Administered 2019-04-01: 10 mmol via INTRAVENOUS
  Filled 2019-04-01: qty 3.33

## 2019-04-01 MED ORDER — POTASSIUM CHLORIDE 10 MEQ/50ML IV SOLN
10.0000 meq | INTRAVENOUS | Status: DC
  Filled 2019-04-01: qty 50

## 2019-04-01 MED ORDER — POTASSIUM CHLORIDE 10 MEQ/100ML IV SOLN
10.0000 meq | INTRAVENOUS | Status: AC
  Administered 2019-04-01 (×2): 10 meq via INTRAVENOUS
  Filled 2019-04-01 (×2): qty 100

## 2019-04-01 MED ORDER — ACETAMINOPHEN 10 MG/ML IV SOLN
1000.0000 mg | Freq: Four times a day (QID) | INTRAVENOUS | Status: AC
  Administered 2019-04-01 – 2019-04-02 (×4): 1000 mg via INTRAVENOUS
  Filled 2019-04-01 (×4): qty 100

## 2019-04-01 NOTE — Progress Notes (Signed)
Physical Therapy Treatment Patient Details Name: Rebecca Lane MRN: [id number] DOB: 1981/05/02 Today's Date: 04/01/2019    History of Present Illness Pt is a 38 yo female with worsening abdominal pain s/p exploratory laparotomy, small bowel resection. PMHx: HTN, hysterectomy.    PT Comments    Pt was agreeable if not eager to get OOB and ambulate in the halls.  Worked on increasing gait speed, challenging balance with speed changes, abrupt directional changes, stepping over expansion strips in the floor.  Increasing speed did produce some increased pain in the abdoment.    Follow Up Recommendations  No PT follow up;Supervision/Assistance - 24 hour     Equipment Recommendations  None recommended by PT    Recommendations for Other Services       Precautions / Restrictions Precautions Precautions: Fall    Mobility  Bed Mobility Overal bed mobility: Needs Assistance Bed Mobility: Rolling;Sidelying to Sit Rolling: Min guard Sidelying to sit: Min guard       General bed mobility comments: slow and deliberate, but without assist  Transfers Overall transfer level: Needs assistance Equipment used: None Transfers: Sit to/from Stand Sit to Stand: Min guard            Ambulation/Gait Ambulation/Gait assistance: Min guard     Gait Pattern/deviations: Step-through pattern Gait velocity: moderate before pain increases Gait velocity interpretation: 1.31 - 2.62 ft/sec, indicative of limited community ambulator General Gait Details: generally guarded, but slowly relaxed and increased gait speed.   Stairs             Wheelchair Mobility    Modified Rankin (Stroke Patients Only)       Balance Overall balance assessment: No apparent balance deficits (not formally assessed)(brushed teeth at sink with good stability)                                          Cognition Arousal/Alertness: Awake/alert Behavior During Therapy: WFL for tasks  assessed/performed Overall Cognitive Status: Within Functional Limits for tasks assessed                                        Exercises      General Comments General comments (skin integrity, edema, etc.): mildly tachy, but otherwise stable      Pertinent Vitals/Pain Pain Assessment: Faces Faces Pain Scale: Hurts even more Pain Location: abdominal incisions with positional changes Pain Descriptors / Indicators: Grimacing;Guarding Pain Intervention(s): Monitored during session;Patient requesting pain meds-RN notified    Home Living                      Prior Function            PT Goals (current goals can now be found in the care plan section) Acute Rehab PT Goals Patient Stated Goal: back independent, at work PT Goal Formulation: With patient Time For Goal Achievement: 04/14/19 Potential to Achieve Goals: Good Progress towards PT goals: Progressing toward goals    Frequency    Min 3X/week      PT Plan Current plan remains appropriate    Co-evaluation              AM-PAC PT "6 Clicks" Mobility   Outcome Measure  Help needed turning from your back to your side while  in a flat bed without using bedrails?: A Little Help needed moving from lying on your back to sitting on the side of a flat bed without using bedrails?: A Little Help needed moving to and from a bed to a chair (including a wheelchair)?: A Little Help needed standing up from a chair using your arms (e.g., wheelchair or bedside chair)?: A Little Help needed to walk in hospital room?: A Little Help needed climbing 3-5 steps with a railing? : A Little 6 Click Score: 18    End of Session   Activity Tolerance: Patient tolerated treatment well Patient left: in chair;with call bell/phone within reach Nurse Communication: Mobility status PT Visit Diagnosis: Other abnormalities of gait and mobility (R26.89);Pain Pain - part of body: (abdomen)     Time: 1325-1400 PT  Time Calculation (min) (ACUTE ONLY): 35 min  Charges:  $Gait Training: 8-22 mins $Therapeutic Activity: 8-22 mins                     04/01/2019  Rebecca Lane, PT [place] [phone]  (pager) [phone]  (office)   Rebecca Lane 04/01/2019, 5:10 PM

## 2019-04-01 NOTE — Progress Notes (Addendum)
[facility]/Trauma Progress Note  2 Days Post-Op   Assessment/Plan S/P 03/24/19 lap-assisted vag hysterectomy  volvulus secondary to small bowel adhesions to vaginal cuff - S/P diagnostic laparoscopy converted to exploratory laparotomy, small bowel resection - continue NGT until flatus - replace phos and K IV ABLA - Hgb 8.3 today, monitor  FEN: NPO, NGT, replace electrolytes IV VTE: SCD's, lovenox ID: no abx, afebrile, WBC 7.6 Foley: DC 11/18 Follow up: Dr. Bobbye Morton  DISPO: await return of bowel function, ambulate. DC tele, move to 6N   LOS: 2 days    Subjective: CC: abdominal pain  No issues overnight. No flatus or BM. Discussed ileus with pt and importance of ambulation.  Objective: Vital signs in last 24 hours: Temp:  [98.6 F (37 C)-99.5 F (37.5 C)] 98.6 F (37 C) (11/19 0811) Pulse Rate:  [94-122] 106 (11/19 0811) Resp:  [13-27] 13 (11/19 0811) BP: (75-140)/(55-93) 128/90 (11/19 0811) SpO2:  [100 %] 100 % (11/19 0811) Last BM Date: (PTA)  Intake/Output from previous day: 11/18 0701 - 11/19 0700 In: 4485.7 [I.V.:3909.9; IV Piggyback:575.8] Out: 500 [Urine:500] Intake/Output this shift: No intake/output data recorded.  PE:  Gen:  Alert, NAD, pleasant, cooperative Card:  RRR, no M/G/R heard Pulm:  CTA, no W/R/R, effort normal Abd: Soft, ND, +BS, midline with vac, port sites C/D/I, mild generalized TTP, no peritonitis  Skin: no rashes noted, warm and dry Extremities: no TTP or swelling to calves BL   Anti-infectives: Anti-infectives (From admission, onward)   None      Lab Results:  Recent Labs    03/31/19 0704 04/01/19 0448  WBC 9.4 7.6  HGB 9.8* 8.3*  HCT 29.6* 25.3*  PLT 292 274   BMET Recent Labs    03/31/19 0704 04/01/19 0448  NA 136 136  K 3.4* 3.4*  CL 105 105  CO2 24 22  GLUCOSE 190* 132*  BUN <5* <5*  CREATININE 0.45 0.48  CALCIUM 8.0* 7.9*   PT/INR Recent Labs    03/30/19 0429  LABPROT 14.3  INR  1.1   CMP     Component Value Date/Time   NA 136 04/01/2019 0448   K 3.4 (L) 04/01/2019 0448   CL 105 04/01/2019 0448   CO2 22 04/01/2019 0448   GLUCOSE 132 (H) 04/01/2019 0448   BUN <5 (L) 04/01/2019 0448   CREATININE 0.48 04/01/2019 0448   CALCIUM 7.9 (L) 04/01/2019 0448   PROT 8.9 (H) 03/29/2019 2357   ALBUMIN 4.4 03/29/2019 2357   AST 20 03/29/2019 2357   ALT 15 03/29/2019 2357   ALKPHOS 44 03/29/2019 2357   BILITOT 1.6 (H) 03/29/2019 2357   GFRNONAA >60 04/01/2019 0448   GFRAA >60 04/01/2019 0448   Lipase     Component Value Date/Time   LIPASE 20 03/29/2019 2357    Studies/Results: No results found.   Kalman Drape, PA-C [facility] Surgery Please see amion for pager for the following: Myna Hidalgo, W, & Friday 7:00am - 4:30pm Thursdays 7:00am -11:30am

## 2019-04-02 LAB — CBC
HCT: 27.2 % — ABNORMAL LOW (ref 36.0–46.0)
Hemoglobin: 8.8 g/dL — ABNORMAL LOW (ref 12.0–15.0)
MCH: 28.6 pg (ref 26.0–34.0)
MCHC: 32.4 g/dL (ref 30.0–36.0)
MCV: 88.3 fL (ref 80.0–100.0)
Platelets: 320 10*3/uL (ref 150–400)
RBC: 3.08 MIL/uL — ABNORMAL LOW (ref 3.87–5.11)
RDW: 12.9 % (ref 11.5–15.5)
WBC: 7 10*3/uL (ref 4.0–10.5)
nRBC: 0 % (ref 0.0–0.2)

## 2019-04-02 LAB — MAGNESIUM: Magnesium: 1.7 mg/dL (ref 1.7–2.4)

## 2019-04-02 LAB — BASIC METABOLIC PANEL
Anion gap: 11 (ref 5–15)
BUN: <5 mg/dL — ABNORMAL LOW (ref 6–20)
CO2: 21 mmol/L — ABNORMAL LOW (ref 22–32)
Calcium: 8.3 mg/dL — ABNORMAL LOW (ref 8.9–10.3)
Chloride: 103 mmol/L (ref 98–111)
Creatinine, Ser: 0.62 mg/dL (ref 0.44–1.00)
GFR calc Af Amer: >60 mL/min (ref >60)
GFR calc non Af Amer: >60 mL/min (ref >60)
Glucose, Bld: 109 mg/dL — ABNORMAL HIGH (ref 70–99)
Potassium: 3.5 mmol/L (ref 3.5–5.1)
Sodium: 135 mmol/L (ref 135–145)

## 2019-04-02 LAB — PHOSPHORUS: Phosphorus: 3.7 mg/dL (ref 2.5–4.6)

## 2019-04-02 MED ORDER — OXYCODONE HCL 5 MG PO TABS
5.0000 mg | ORAL_TABLET | ORAL | Status: DC | PRN
  Administered 2019-04-02 – 2019-04-03 (×5): 10 mg via ORAL
  Filled 2019-04-02: qty 2
  Filled 2019-04-02: qty 1
  Filled 2019-04-02 (×4): qty 2

## 2019-04-02 MED ORDER — ACETAMINOPHEN 325 MG PO TABS
650.0000 mg | ORAL_TABLET | Freq: Four times a day (QID) | ORAL | Status: DC
  Administered 2019-04-02 – 2019-04-03 (×4): 650 mg via ORAL
  Filled 2019-04-02 (×4): qty 2

## 2019-04-02 MED ORDER — DOCUSATE SODIUM 100 MG PO CAPS
100.0000 mg | ORAL_CAPSULE | Freq: Every day | ORAL | Status: DC
  Administered 2019-04-03: 100 mg via ORAL
  Filled 2019-04-02: qty 1

## 2019-04-02 NOTE — Progress Notes (Signed)
Occupational Therapy Treatment Patient Details Name: Rebecca Lane MRN: [id number] DOB: July 07, 1980 Today's Date: 04/02/2019    History of present illness Pt is a 38 yo female with worsening abdominal pain s/p exploratory laparotomy, small bowel resection. PMHx: HTN, hysterectomy.   OT comments  Pt progressing very well and meeting goals today. No further OT required at this time as pt performing LB dressing with figure 4 technique to cross legs to avoid bending at abdomen to decrease pain. Discomfort  In abdomen throughout session. No AE education required. Pt requires increased time and able to ambulate 300' with supervisionA. OT signing off.   Follow Up Recommendations  No OT follow up    Equipment Recommendations  3 in 1 bedside commode    Recommendations for Other Services      Precautions / Restrictions Precautions Precautions: Fall Restrictions Weight Bearing Restrictions: No       Mobility Bed Mobility               General bed mobility comments: standing at sink with NT for bathing task  Transfers Overall transfer level: Needs assistance Equipment used: None Transfers: Sit to/from Stand Sit to Stand: Supervision              Balance Overall balance assessment: No apparent balance deficits (not formally assessed)                                         ADL either performed or assessed with clinical judgement   ADL Overall ADL's : Needs assistance/impaired Eating/Feeding: Modified independent;Sitting   Grooming: Supervision/safety;Standing   Upper Body Bathing: Supervision/ safety;Standing   Lower Body Bathing: Min guard;Sitting/lateral leans Lower Body Bathing Details (indicate cue type and reason): increased time with figure 4 technique Upper Body Dressing : Set up;Sitting   Lower Body Dressing: Supervision/safety;Cueing for safety;Sitting/lateral leans;Sit to/from stand Lower Body Dressing Details (indicate cue type and  reason): Pt performing with figure 4 technique for all LB ADL; increase time. Toilet Transfer: Warehouse manager and Hygiene: Supervision/safety;Set up;Sitting/lateral lean;Sit to/from stand;Cueing for safety       Functional mobility during ADLs: Supervision/safety;Cueing for safety General ADL Comments: Pt progressing very well and meeting goals today. No further OT required at this time as pt performing LB dressing with figure 4 technique to cross legs to avoid bending at abdomen.     Vision   Vision Assessment?: No apparent visual deficits   Perception     Praxis      Cognition Arousal/Alertness: Awake/alert Behavior During Therapy: WFL for tasks assessed/performed Overall Cognitive Status: Within Functional Limits for tasks assessed                                          Exercises     Shoulder Instructions       General Comments VSS. NG tube out so pt more comfortable to perform ADL.    Pertinent Vitals/ Pain       Pain Assessment: Faces Faces Pain Scale: Hurts little more Pain Location: abdominal incisions with positional changes Pain Descriptors / Indicators: Discomfort Pain Intervention(s): Monitored during session  Home Living  Prior Functioning/Environment              Frequency           Progress Toward Goals  OT Goals(current goals can now be found in the care plan section)  Progress towards OT goals: Progressing toward goals  Acute Rehab OT Goals Patient Stated Goal: back independent, at work OT Goal Formulation: With patient Time For Goal Achievement: 04/14/19 Potential to Achieve Goals: Good ADL Goals Pt Will Perform Lower Body Dressing: with set-up;sit to/from stand;with adaptive equipment Pt Will Perform Toileting - Clothing Manipulation and hygiene: with set-up;sitting/lateral leans;with adaptive equipment Additional  ADL Goal #1: Pt will increase to supervisionA level for ADL  Plan Discharge plan remains appropriate    Co-evaluation                 AM-PAC OT "6 Clicks" Daily Activity     Outcome Measure   Help from another person eating meals?: None Help from another person taking care of personal grooming?: None Help from another person toileting, which includes using toliet, bedpan, or urinal?: A Little Help from another person bathing (including washing, rinsing, drying)?: A Little Help from another person to put on and taking off regular upper body clothing?: None Help from another person to put on and taking off regular lower body clothing?: None 6 Click Score: 22    End of Session    OT Visit Diagnosis: Unsteadiness on feet (R26.81);Muscle weakness (generalized) (M62.81);Pain Pain - part of body: (abdomen)   Activity Tolerance Patient tolerated treatment well   Patient Left in chair;with call bell/phone within reach   Nurse Communication Mobility status        Time: [id number] OT Time Calculation (min): 31 min  Charges: OT General Charges $OT Visit: 1 Visit OT Treatments $Self Care/Home Management : 23-37 mins  Rebecca Hail Harold Hedge) Marsa Aris OTR/L Acute Rehabilitation Services Pager: [phone] Office: [phone]    Audie Pinto 04/02/2019, 3:50 PM

## 2019-04-02 NOTE — Progress Notes (Signed)
[facility]/Trauma Progress Note  3 Days Post-Op   Assessment/Plan S/P 03/24/19 lap-assisted vag hysterectomy  volvulus secondary to small bowel adhesions to vaginal cuff - S/Pdiagnostic laparoscopy converted to exploratory laparotomy, small bowel resection - replace electrolytes as needed ABLA - Hgb 8.8 today, monitor  [id number] VTE: SCD's, lovenox ID:no abx, afebrile, WBC 7.0 Foley:DC 11/18 Follow up:Dr. Bobbye Morton  DISPO:DC NGT, sips, ambulate. Am labs   LOS: 3 days    Subjective: CC: abdominal pain  No issues overnight. Pt is having flatus. No nausea.   Objective: Vital signs in last 24 hours: Temp:  [98.1 F (36.7 C)-99.1 F (37.3 C)] 98.1 F (36.7 C) (11/20 0758) Pulse Rate:  [78-91] 91 (11/20 0758) Resp:  [14-18] 16 (11/20 0758) BP: (129-139)/(84-95) 133/94 (11/20 0758) SpO2:  [98 %-100 %] 100 % (11/20 0758) Last BM Date: (PTA)  Intake/Output from previous day: 11/19 0701 - 11/20 0700 In: 3637.9 [I.V.:2737.8; IV Piggyback:900] Out: 260 [Emesis/NG output:200; Drains:60] Intake/Output this shift: No intake/output data recorded.  PE:  Gen: Alert, NAD, pleasant, cooperative Card: RRR, no M/G/R heard Pulm: CTA, no W/R/R, effort normal Abd: Soft,ND, +BS, midline with vac, port sites C/D/I, mild generalized TTP, no peritonitis Skin: no rashes noted, warm and dry   Anti-infectives: Anti-infectives (From admission, onward)   None      Lab Results:  Recent Labs    04/01/19 0448 04/02/19 0624  WBC 7.6 7.0  HGB 8.3* 8.8*  HCT 25.3* 27.2*  PLT 274 320   BMET Recent Labs    04/01/19 0448 04/02/19 0624  NA 136 135  K 3.4* 3.5  CL 105 103  CO2 22 21*  GLUCOSE 132* 109*  BUN <5* <5*  CREATININE 0.48 0.62  CALCIUM 7.9* 8.3*   PT/INR No results for input(s): LABPROT, INR in the last 72 hours. CMP     Component Value Date/Time   NA 135 04/02/2019 0624   K 3.5 04/02/2019 0624   CL 103 04/02/2019 0624   CO2 21 (L)  04/02/2019 0624   GLUCOSE 109 (H) 04/02/2019 0624   BUN <5 (L) 04/02/2019 0624   CREATININE 0.62 04/02/2019 0624   CALCIUM 8.3 (L) 04/02/2019 0624   PROT 8.9 (H) 03/29/2019 2357   ALBUMIN 4.4 03/29/2019 2357   AST 20 03/29/2019 2357   ALT 15 03/29/2019 2357   ALKPHOS 44 03/29/2019 2357   BILITOT 1.6 (H) 03/29/2019 2357   GFRNONAA >60 04/02/2019 0624   GFRAA >60 04/02/2019 0624   Lipase     Component Value Date/Time   LIPASE 20 03/29/2019 2357    Studies/Results: No results found.   Kalman Drape, PA-C [facility] Surgery Please see amion for pager for the following: Myna Hidalgo, W, & Friday 7:00am - 4:30pm Thursdays 7:00am -11:30am

## 2019-04-02 NOTE — Consult Note (Signed)
[facility] Nurse Consult Note: Reason for Consult: Midline abdominal incision with NPWT (VAC) therapy in place Wound type: Midline surgical  Pressure Injury POA: NA Measurement: 14 cmx 4 cm x 1 cm  Wound bed: beefy red  Bleeds with cleansing Drainage (amount, consistency, odor) minimal serosanguinous NG tube in place Periwound:intact Dressing procedure/placement/frequency:1 piece black foam. Seal immediately achieved at 125  Change M/W/F Likely will discharge Sunday with NS moist dressing changes.  No supplies ordered.  Domenic Moras MSN, RN, FNP-BC CWON Wound, Ostomy, Continence Nurse Pager [phone]

## 2019-04-03 LAB — TYPE AND SCREEN
ABO/RH(D): B POS
Antibody Screen: NEGATIVE
Unit division: 0
Unit division: 0

## 2019-04-03 LAB — BPAM RBC
Blood Product Expiration Date: 202012162359
Blood Product Expiration Date: 202012162359
ISSUE DATE / TIME: 202011170636
ISSUE DATE / TIME: 202011170636
Unit Type and Rh: 7300
Unit Type and Rh: 7300

## 2019-04-03 MED ORDER — IBUPROFEN 800 MG PO TABS: 800.0000 mg | ORAL_TABLET | Freq: Three times a day (TID) | ORAL | 0 refills | Status: AC | PRN

## 2019-04-03 MED ORDER — OXYCODONE HCL 5 MG PO TABS: 5.0000 mg | ORAL_TABLET | Freq: Four times a day (QID) | ORAL | 0 refills | Status: DC | PRN

## 2019-04-03 MED ORDER — PANTOPRAZOLE SODIUM 40 MG PO TBEC: 40.0000 mg | DELAYED_RELEASE_TABLET | Freq: Every day | ORAL | Status: DC

## 2019-04-03 NOTE — TOC Transition Note (Signed)
Transition of Care [facility]) - CM/SW Discharge Note   Patient Details  Name: Rebecca Lane MRN: [id number] Date of Birth: [birth year]  Transition of Care [facility] CM/SW Contact:  Claudie Leach, RN Phone Number: [phone] 04/03/2019, 4:34 PM      Final next level of care: Home/Self Care         Discharge Plan and Services                DME Arranged: 3-N-1 DME Agency: [employer] Date DME Agency Contacted: 04/03/19 Time DME Agency Contacted: 71 Representative spoke with at DME Agency: Bertrum Sol

## 2019-04-03 NOTE — Progress Notes (Signed)
     Subjective:    Overnight Issues: NAEON, tol CLD  Objective:  Vital signs for last 24 hours: Temp:  [97.7 F (36.5 C)-98.5 F (36.9 C)] 98.1 F (36.7 C) (11/21 1150) Pulse Rate:  [70-85] 70 (11/21 1150) Resp:  [16-20] 16 (11/21 1150) BP: (120-146)/(80-97) 120/85 (11/21 1150) SpO2:  [98 %-100 %] 100 % (11/21 1150)  Hemodynamic parameters for last 24 hours:    Intake/Output from previous day: 11/20 0701 - 11/21 0700 In: 945 [P.O.:120; I.V.:725; IV Piggyback:100] Out: 200 [Emesis/NG output:200]  Intake/Output this shift: No intake/output data recorded.  Vent settings for last 24 hours:    Physical Exam:  Gen: comfortable, no distress Neuro: non-focal exam HEENT: PERRL Neck: supple CV: RRR Pulm: unlabored breathing Abd: soft, NT GU: spont voids Extr: wwp, no edema   No results found for this or any previous visit (from the past 24 hour(s)).  Assessment & Plan: Present on Admission: . Volvulus ([place])    LOS: 4 days   Additional comments:I reviewed the patient's new clinical lab test results.    S/P 03/24/19 lap-assisted vag hysterectomy  volvulus secondary to small bowel adhesions to vaginal cuff - S/Pdiagnostic laparoscopy converted to exploratory laparotomy, small bowel resection - replace electrolytes as needed ABLA - stable  FEN:tol soft diet this AM VTE: SCD's, lovenox ID:no abx, afebrile, WBC7.0 Follow up:Dr. Bobbye Morton  DISPO:home today after wet-to -dry teaching of midline wound  Jesusita Oka, MD Trauma & General Surgery Please use [email or web address] to contact on call provider  04/03/2019  *Care during the described time interval was provided by me. I have reviewed this patient's available data, including medical history, events of note, physical examination and test results as part of my evaluation.

## 2019-04-03 NOTE — Discharge Instructions (Signed)
[facility]      [facility] [phone]  OPEN ABDOMINAL SURGERY: POST OP INSTRUCTIONS  Always review your discharge instruction sheet given to you by the facility where your surgery was performed.  IF YOU HAVE DISABILITY OR FAMILY LEAVE FORMS, YOU MUST BRING THEM TO THE OFFICE FOR PROCESSING.  PLEASE DO NOT GIVE THEM TO YOUR DOCTOR.  1. A prescription for pain medication may be given to you upon discharge.  Take your pain medication as prescribed, if needed.  If narcotic pain medicine is not needed, then you may take acetaminophen (Tylenol) or ibuprofen (Advil) as needed. 2. Take your usually prescribed medications unless otherwise directed. 3. If you need a refill on your pain medication, please contact your pharmacy. They will contact our office to request authorization.  Prescriptions will not be filled after 5pm or on week-ends. 4. You should follow a light diet the first few days after arrival home, such as soup and crackers, pudding, etc.unless your doctor has advised otherwise. A high-fiber, low fat diet can be resumed as tolerated.   Be sure to include lots of fluids daily. Most patients will experience some swelling and bruising on the chest and neck area.  Ice packs will help.  Swelling and bruising can take several days to resolve 5. Most patients will experience some swelling and bruising in the area of the incision. Ice pack will help. Swelling and bruising can take several days to resolve..  6. It is common to experience some constipation if taking pain medication after surgery.  Increasing fluid intake and taking a stool softener will usually help or prevent this problem from occurring.  A mild laxative (Milk of Magnesia or Miralax) should be taken according to package directions if there are no bowel movements after 48 hours. 7.  You may have steri-strips (small skin tapes) in place directly over the incision.  These strips should be left on the skin for 7-10 days.  If your  surgeon used skin glue on the incision, you may shower in 24 hours.  The glue will flake off over the next 2-3 weeks.  Any sutures or staples will be removed at the office during your follow-up visit. You may find that a light gauze bandage over your incision may keep your staples from being rubbed or pulled. You may shower and replace the bandage daily. 8. ACTIVITIES:  You may resume regular (light) daily activities beginning the next day--such as daily self-care, walking, climbing stairs--gradually increasing activities as tolerated.  You may have sexual intercourse when it is comfortable.  Refrain from any heavy lifting or straining until approved by your doctor. a. You may drive when you no longer are taking prescription pain medication, you can comfortably wear a seatbelt, and you can safely maneuver your car and apply brakes b. Return to Work: ___________________________________ 9. You should see your doctor in the office for a follow-up appointment approximately two weeks after your surgery.  Make sure that you call for this appointment within a day or two after you arrive home to insure a convenient appointment time. OTHER INSTRUCTIONS:  _____________________________________________________________ _____________________________________________________________  WHEN TO CALL YOUR DOCTOR: 1. Fever over 101.0 2. Inability to urinate 3. Nausea and/or vomiting 4. Extreme swelling or bruising 5. Continued bleeding from incision. 6. Increased pain, redness, or drainage from the incision. 7. Difficulty swallowing or breathing 8. Muscle cramping or spasms. 9. Numbness or tingling in hands or feet or around lips.  The clinic staff is available to   answer your questions during regular business hours.  Please don't hesitate to call and ask to speak to one of the nurses if you have concerns.  For further questions, please visit [email or web address]  Wet to Dry WOUND CARE: - Change  dressing twice daily - Supplies: sterile saline, kerlex, scissors, ABD pads, tape  1. Remove dressing and all packing carefully, moistening with sterile saline as needed to avoid packing/internal dressing sticking to the wound. 2.   Clean edges of skin around the wound with water/gauze, making sure there is no tape debris or leakage left on skin that could cause skin irritation or breakdown. 3.   Dampen and clean kerlex with sterile saline and pack wound from wound base to skin level, making sure to take note of any possible areas of wound tracking, tunneling and packing appropriately. Wound can be packed loosely. Trim kerlex to size if a whole kerlex is not required. 4.   Cover wound with a dry ABD pad and secure with tape.  5.   Write the date/time on the dry dressing/tape to better track when the last dressing change occurred. - apply any skin protectant/powder if recommended by clinician to protect skin/skin folds. - change dressing as needed if leakage occurs, wound gets contaminated, or patient requests to shower. - You may shower daily with wound open and following the shower the wound should be dried and a clean dressing placed.  - Medical grade tape as well as packing supplies can be found at [employer] on [address]. The remaining supplies can be found at your local drug store, [employer] etc.  

## 2019-04-12 NOTE — Discharge Summary (Signed)
[facility] Surgery/Trauma Discharge Summary   Patient ID: Rebecca Lane MRN: [id number] DOB/AGE: [birth year] 38 y.o.  Admit date: 03/29/2019 Discharge date: 04/03/2019  Admitting Diagnosis: Volvulus   Discharge Diagnosis Patient Active Problem List   Diagnosis Date Noted  . Volvulus ([place]) 03/30/2019    Consultants none  Imaging: No results found.  Procedures Rebecca Lane (03/30/19) - diagnostic laparoscopy converted to exploratory laparotomy, small bowel resection. Intraoperative findings were volvulus secondary to small bowel adhesions to vaginal cuff  HPI: [name] s/p hysterectomy 03/24/2019 by Rebecca Lane at [place] presents today with worsening abdominal pain and new n/v within the last 24h. She initially thought her pain was post-op pain, but presented after repeatedly vomiting her pain meds. She reports multiple episodes of breaking out in sweats and chills   Hospital Course:  Workup showed volvulus. Patient was admitted and taken emergently to the OR for  procedure listed above.  Tolerated procedure well and was transferred to the floor. Post op course complicated by ileus. Ileus resolved and NGT was removed. Diet was advanced as tolerated.    On POD#4, the patient was voiding well, tolerating diet, ambulating well, pain well controlled, vital signs stable, incisions c/d/i and felt stable for discharge home.  Patient will follow up as outlined below and knows to call with questions or concerns.    I was not present, did not see the patient, nor took part in this patient's discharge. All the information provided in this discharge summary was taken from the patients EMR.   Physical Exam: See progress note by Rebecca Lane on 11/21.  Allergies as of 04/03/2019      Reactions   Penicillins Other (See Comments)   Reaction:  Unknown--childhood reaction  Has patient had a PCN reaction causing immediate rash, facial/tongue/throat swelling, SOB or lightheadedness  with hypotension: unknown Has patient had a PCN reaction causing severe rash involving mucus membranes or skin necrosis: unknown Has patient had a PCN reaction that required hospitalization : unknown Has patient had a PCN reaction occurring within the last 10 years: no If all answers are "NO", then may proceed with Cephalosporin use.      Medication List    STOP taking these medications   oxyCODONE-acetaminophen 5-325 MG tablet Commonly known as: PERCOCET/ROXICET     TAKE these medications   docusate sodium 100 MG capsule Commonly known as: COLACE Take 100 mg by mouth 2 (two) times daily as needed for mild constipation.   ibuprofen 800 MG tablet Commonly known as: ADVIL Take 1 tablet (800 mg total) by mouth every 8 (eight) hours as needed.   oxyCODONE 5 MG immediate release tablet Commonly known as: Oxy IR/ROXICODONE Take 1 tablet (5 mg total) by mouth every 6 (six) hours as needed for severe pain. What changed: when to take this            Discharge Care Instructions  (From admission, onward)         Start     Ordered   04/03/19 0000  Discharge wound care:    Comments: Saline moistened dressings two times a day   04/03/19 1426           Follow-up Information    Rebecca Oka, MD Follow up.   Specialty: Surgery Why: Please call to schedule an appointment for follow up in 3 weeks.  Contact information: [address] [place] [address] [phone]  Signed: [facility] 04/12/2019, 11:42 AM Please see amion for pager for the following: M, T, W, & Friday 7:00am - 4:30pm Thursdays 7:00am -11:30am

## 2019-06-17 DIAGNOSIS — N939 Abnormal uterine and vaginal bleeding, unspecified: Secondary | ICD-10-CM | POA: Diagnosis not present

## 2019-06-17 DIAGNOSIS — Z6824 Body mass index (BMI) 24.0-24.9, adult: Secondary | ICD-10-CM | POA: Diagnosis not present

## 2019-06-17 DIAGNOSIS — Z01419 Encounter for gynecological examination (general) (routine) without abnormal findings: Secondary | ICD-10-CM | POA: Diagnosis not present

## 2019-06-17 DIAGNOSIS — R319 Hematuria, unspecified: Secondary | ICD-10-CM | POA: Diagnosis not present

## 2019-12-21 DIAGNOSIS — M531 Cervicobrachial syndrome: Secondary | ICD-10-CM | POA: Diagnosis not present

## 2019-12-21 DIAGNOSIS — M9908 Segmental and somatic dysfunction of rib cage: Secondary | ICD-10-CM | POA: Diagnosis not present

## 2019-12-21 DIAGNOSIS — M9901 Segmental and somatic dysfunction of cervical region: Secondary | ICD-10-CM | POA: Diagnosis not present

## 2019-12-21 DIAGNOSIS — M9902 Segmental and somatic dysfunction of thoracic region: Secondary | ICD-10-CM | POA: Diagnosis not present

## 2019-12-23 DIAGNOSIS — M9901 Segmental and somatic dysfunction of cervical region: Secondary | ICD-10-CM | POA: Diagnosis not present

## 2019-12-23 DIAGNOSIS — M9902 Segmental and somatic dysfunction of thoracic region: Secondary | ICD-10-CM | POA: Diagnosis not present

## 2019-12-23 DIAGNOSIS — M531 Cervicobrachial syndrome: Secondary | ICD-10-CM | POA: Diagnosis not present

## 2019-12-23 DIAGNOSIS — M9908 Segmental and somatic dysfunction of rib cage: Secondary | ICD-10-CM | POA: Diagnosis not present

## 2019-12-26 IMAGING — US US TRANSVAGINAL NON-OB
1 series · 13 of 13 positions shown · non-contrast
Comparison: 04/03/2016 CT abdomen/pelvis.

CLINICAL DATA: 37-year-old female presents with pelvic pain. LMP
05/27/2018.

EXAM:
TRANSABDOMINAL AND TRANSVAGINAL ULTRASOUND OF PELVIS
DOPPLER ULTRASOUND OF OVARIES
TECHNIQUE: Both transabdominal and transvaginal ultrasound examinations of the
pelvis were performed. Transabdominal technique was performed for
global imaging of the pelvis including uterus, ovaries, adnexal
regions, and pelvic cul-de-sac.
It was necessary to proceed with endovaginal exam following the
transabdominal exam to visualize the endometrium and adnexa. Color
and duplex Doppler ultrasound was utilized to evaluate blood flow to
the ovaries.

[Series 1: us transvaginal non-ob · 13 of 13 slices shown]
[im 1/13]
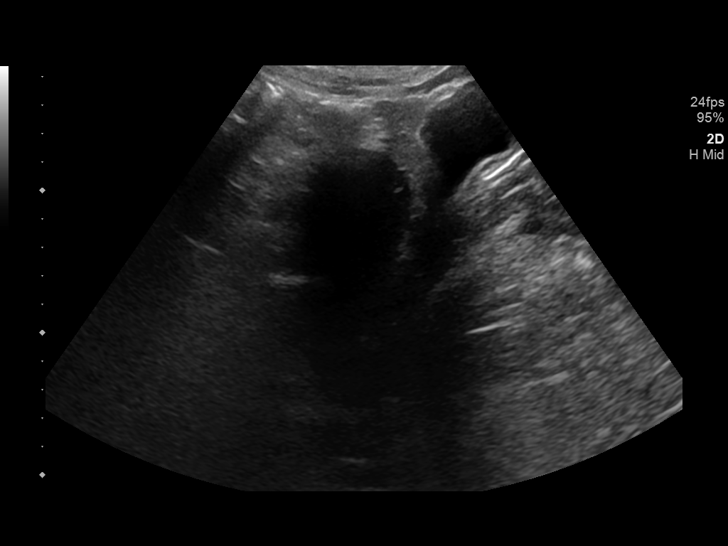
[im 2/13]
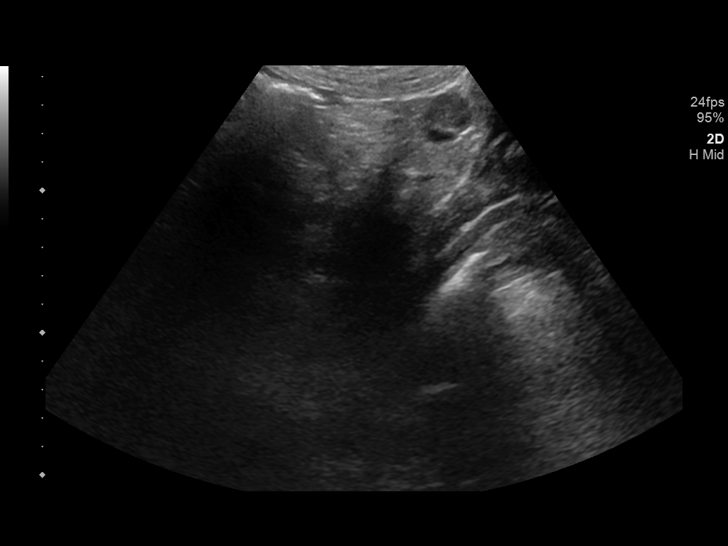
[im 3/13]
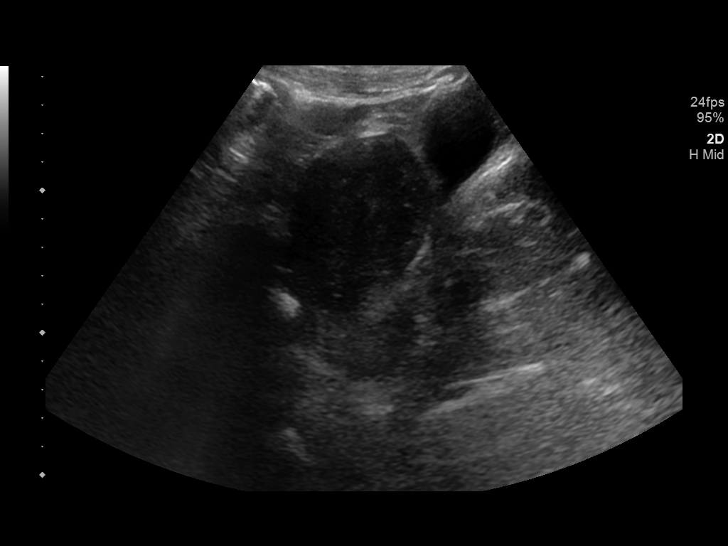
[im 4/13]
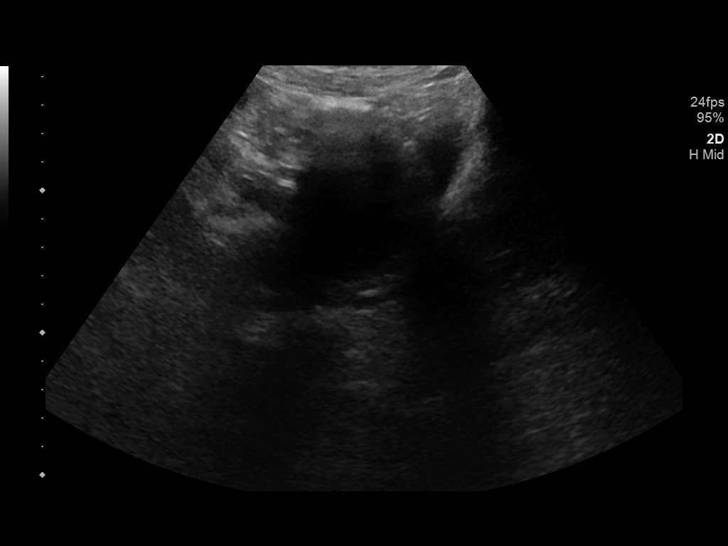
[im 5/13]
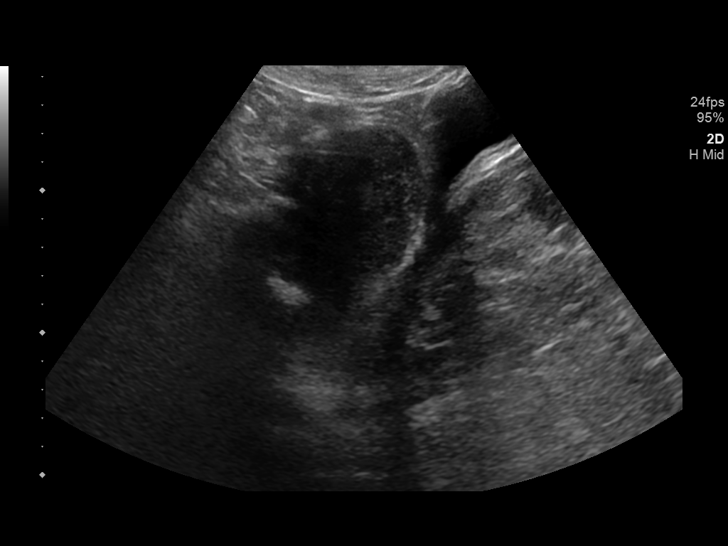
[im 6/13]
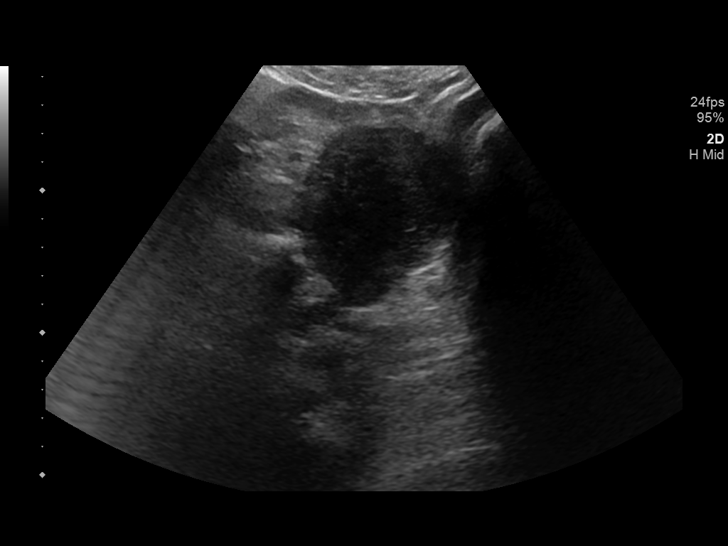
[im 7/13]
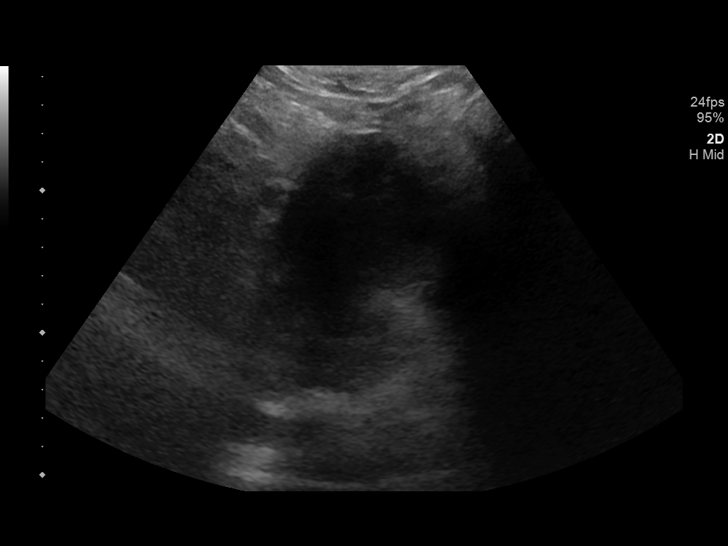
[im 8/13]
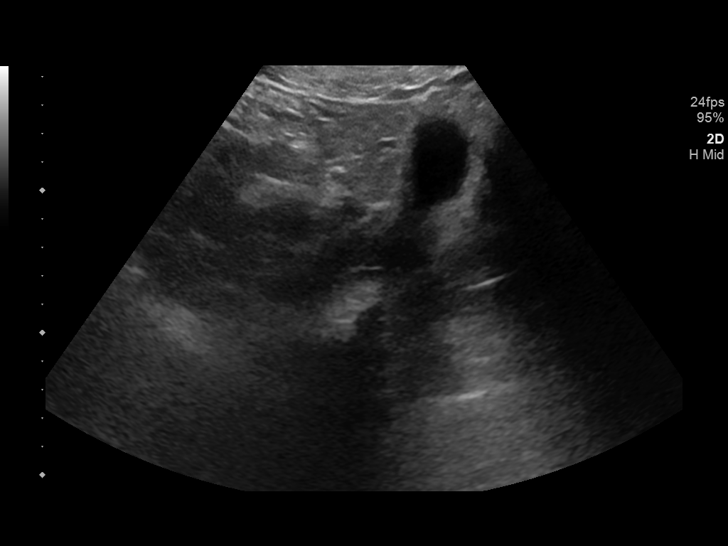
[im 9/13]
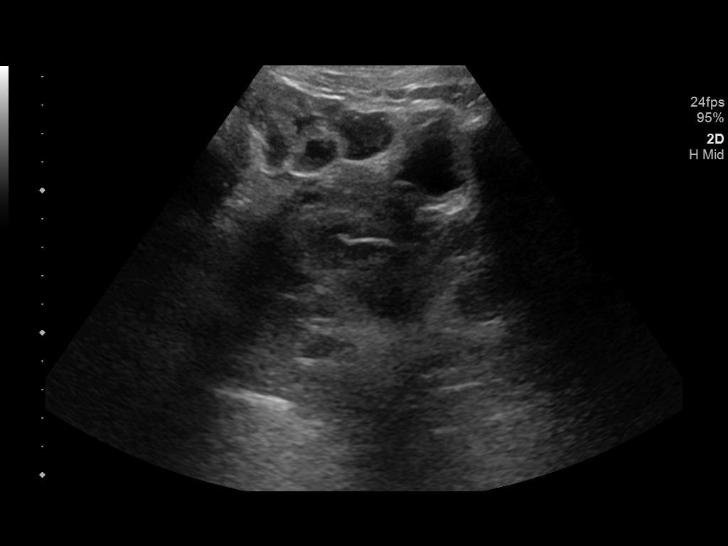
[im 10/13]
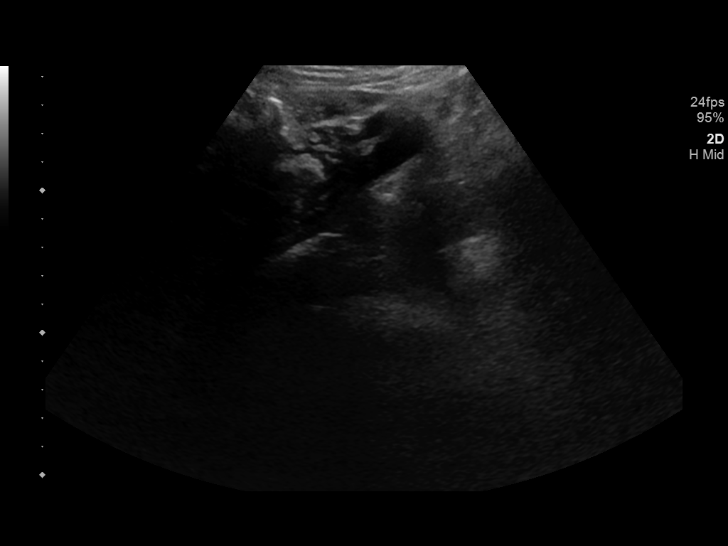
[im 11/13]
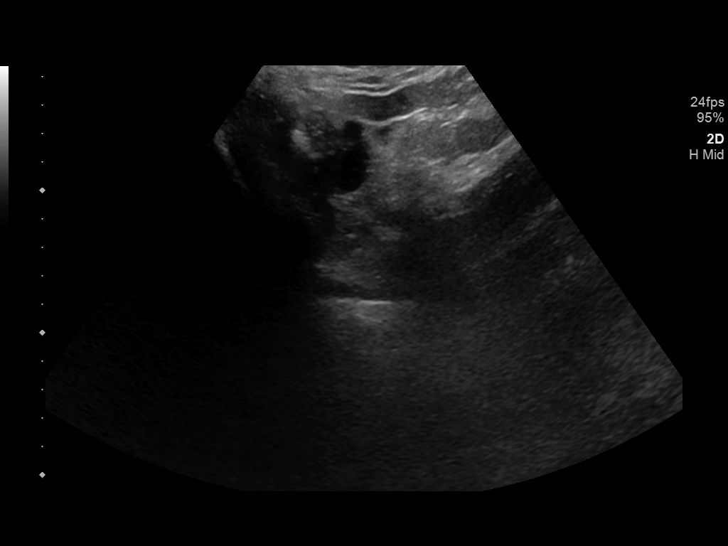
[im 12/13]
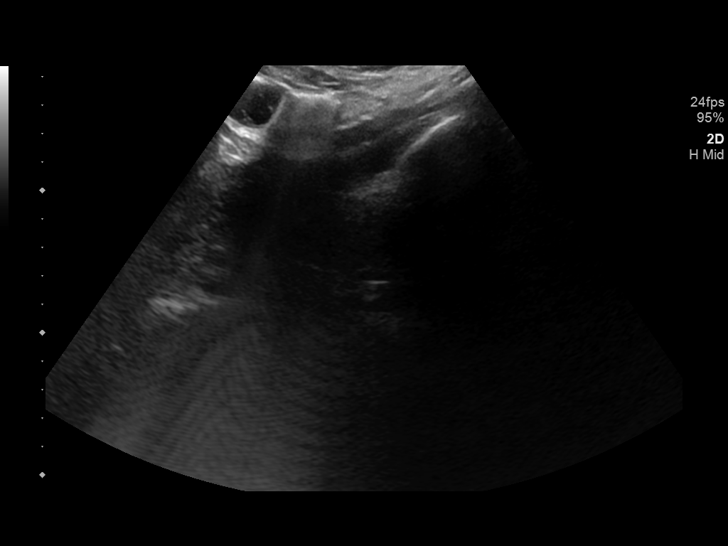
[im 13/13]
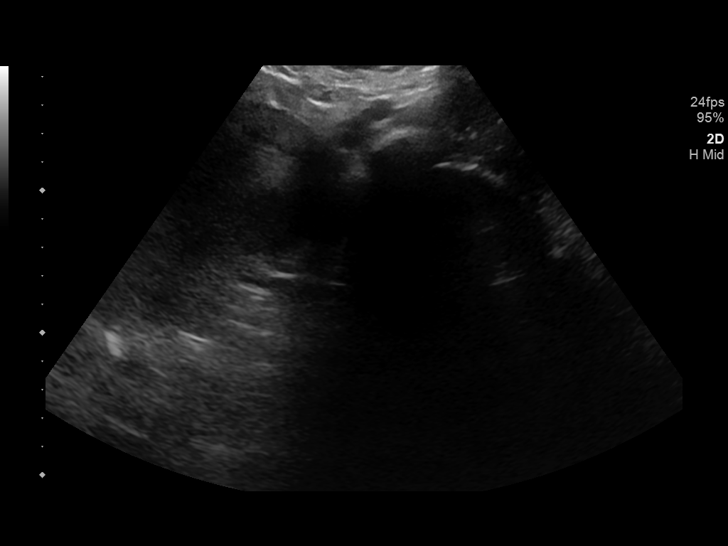

[13 of 13 positions shown; findings below may reference images not displayed]

FINDINGS: Uterus

Measurements: 9.7 x 5.0 x 6.3 cm = volume: 160 mL. Anteverted uterus
is mildly enlarged and otherwise normal in configuration, with no
uterine fibroids or other myometrial abnormalities.

Endometrium

Thickness: 13 mm. Mildly heterogeneous endometrium with no
endometrial cavity fluid or focal endometrial mass.

Right ovary

Measurements: 2.5 x 1.6 x 2.4 cm = volume: 5.0 mL. Small 1.5 x 1.0 x
1.3 cm hemorrhagic right ovarian cyst. No abnormal right ovarian or
right adnexal masses.

Left ovary

Measurements: 3.6 x 2.0 x 2.4 cm = volume: 8.9 mL. Simple 1.5 x
x 1.6 cm left ovarian follicular cyst. No abnormal left ovarian or
left adnexal masses.

Pulsed Doppler evaluation of both ovaries demonstrates normal
low-resistance arterial and venous waveforms.

Other findings

No abnormal free fluid.
IMPRESSION: 1. No evidence of adnexal torsion.
2. No suspicious ovarian or adnexal findings. Small 1.5 cm
hemorrhagic right ovarian cyst.
3. Unremarkable anteverted uterus.

## 2019-12-27 DIAGNOSIS — M9901 Segmental and somatic dysfunction of cervical region: Secondary | ICD-10-CM | POA: Diagnosis not present

## 2019-12-27 DIAGNOSIS — M9908 Segmental and somatic dysfunction of rib cage: Secondary | ICD-10-CM | POA: Diagnosis not present

## 2019-12-27 DIAGNOSIS — M9902 Segmental and somatic dysfunction of thoracic region: Secondary | ICD-10-CM | POA: Diagnosis not present

## 2019-12-27 DIAGNOSIS — M531 Cervicobrachial syndrome: Secondary | ICD-10-CM | POA: Diagnosis not present

## 2020-03-20 DIAGNOSIS — F411 Generalized anxiety disorder: Secondary | ICD-10-CM | POA: Diagnosis not present

## 2020-05-22 ENCOUNTER — Telehealth: Payer: Self-pay | Admitting: Gastroenterology

## 2020-05-22 ENCOUNTER — Encounter: Payer: Self-pay | Admitting: Gastroenterology

## 2020-05-22 NOTE — Telephone Encounter (Signed)
Patient called states she having a lot of abdominal pain and hurts a lot when she lays down started last night. Has a a lot of concerns please advise

## 2020-05-22 NOTE — Telephone Encounter (Signed)
Patient with hx of constipation, last OV was 2018.  She reports abdominal pain upper and lower with vomiting that started last night.  Pain is worse when she lays down.  She is asking to be seen today.  She is advised that unfortunately no appts today or this week.  She is advised to try and urgent care or if pain and symptoms become severe, she will need ED eval.

## 2020-06-06 ENCOUNTER — Ambulatory Visit: Payer: BC Managed Care – PPO | Admitting: Gastroenterology

## 2020-06-06 ENCOUNTER — Encounter: Payer: Self-pay | Admitting: Gastroenterology

## 2020-06-06 VITALS — BP 130/78 | HR 80 | Ht 68.0 in | Wt 153.6 lb

## 2020-06-06 DIAGNOSIS — R112 Nausea with vomiting, unspecified: Secondary | ICD-10-CM

## 2020-06-06 DIAGNOSIS — R1084 Generalized abdominal pain: Secondary | ICD-10-CM | POA: Insufficient documentation

## 2020-06-06 DIAGNOSIS — Z8719 Personal history of other diseases of the digestive system: Secondary | ICD-10-CM

## 2020-06-06 DIAGNOSIS — R140 Abdominal distension (gaseous): Secondary | ICD-10-CM

## 2020-06-06 DIAGNOSIS — R634 Abnormal weight loss: Secondary | ICD-10-CM | POA: Insufficient documentation

## 2020-06-06 NOTE — Patient Instructions (Signed)
If you are age 40 or older, your body mass index should be between 23-30. Your Body mass index is 23.35 kg/m. If this is out of the aforementioned range listed, please consider follow up with your Primary Care Provider.  If you are age 79 or younger, your body mass index should be between 19-25. Your Body mass index is 23.35 kg/m. If this is out of the aformentioned range listed, please consider follow up with your Primary Care Provider.   You have been scheduled for a CT scan of the abdomen and pelvis at [facility], 1st floor Radiology. You are scheduled on 06/14/20  at 7:30am. You should arrive 15 minutes prior to your appointment time for registration.  Please pick up 2 bottles of contrast from [place] at least 3 days prior to your scan. The solution may taste better if refrigerated, but do NOT add ice or any other liquid to this solution. Shake well before drinking.   Please follow the written instructions below on the day of your exam:   1) Do not eat anything after 4:30am (4 hours prior to your test)   2) Drink 1 bottle of contrast @ 5:30am (2 hours prior to your exam)  Remember to shake well before drinking and do NOT pour over ice.     Drink 1 bottle of contrast @ 6:30am (1 hour prior to your exam)   You may take any medications as prescribed with a small amount of water, if necessary. If you take any of the following medications: METFORMIN, GLUCOPHAGE, GLUCOVANCE, AVANDAMET, RIOMET, FORTAMET, [place] MET, JANUMET, GLUMETZA or METAGLIP, you MAY be asked to HOLD this medication 48 hours AFTER the exam.   The purpose of you drinking the oral contrast is to aid in the visualization of your intestinal tract. The contrast solution may cause some diarrhea. Depending on your individual set of symptoms, you may also receive an intravenous injection of x-ray contrast/dye. Plan on being at [facility] for 45 minutes or longer, depending on the type of exam you are having performed.   If  you have any questions regarding your exam or if you need to reschedule, you may call [facility] at [phone] between the hours of 8:00 am and 5:00 pm, Monday-Friday.   Thank you for choosing me and [facility].  Alonza Bogus, PA-C

## 2020-06-06 NOTE — Progress Notes (Signed)
06/06/2020 Rebecca Lane [id number] 1981-02-16   HISTORY OF PRESENT ILLNESS: This is a 40 year old female who is a patient of Dr. Lynne Leader.  She is known to him for colonoscopy in October 2018 at which time she was found to have 1 hyperplastic polyp that was removed, diverticulosis in the left colon, and internal hemorrhoids.  In November 2020 she developed small bowel obstruction with associated volvulus and required a laparotomy.  She is here today now with complaints of intermittent episodes of sudden onset severe abdominal bloating and distention with associated pain followed by episodes of vomiting.  She says that typically the symptoms come on suddenly and then when she vomits a couple of times the symptoms will resolve.  This has been happening usually a couple times a month for the past 6 months or so.  She also reports about a 12 pound weight loss.  She says that she has been eating, but is more conscious of what she eats.  She says that she still has an appetite, etc.   Past Medical History:  Diagnosis Date  . Allergy   . Anemia   . Anxiety   . Depression   . Diverticulitis   . Heart murmur   . Hx of ovarian cyst   . Hx of varicella   . Preeclampsia   . Uterine polyp    Past Surgical History:  Procedure Laterality Date  . DILATION AND CURETTAGE OF UTERUS    . LAPAROSCOPY N/A 03/30/2019   Procedure: LAPAROSCOPY DIAGNOSTIC, EXPLORATORY LAPAROTOMY, SMALL BOWEL RESECTION;  Surgeon: Jesusita Oka, MD;  Location: [facility];  Service: General;  Laterality: N/A;  . WISDOM TOOTH EXTRACTION      reports that she quit smoking about 12 years ago. She has never used smokeless tobacco. She reports current alcohol use of about 1.0 standard drink of alcohol per week. She reports that she does not use drugs. family history includes Diabetes in her maternal grandmother and another family member; Hypertension in her maternal grandmother and mother; Osteoporosis in her mother; Stroke in her  maternal grandmother; Thrombophlebitis in her paternal grandfather. Allergies  Allergen Reactions  . Penicillins Other (See Comments)    Reaction:  Unknown--childhood reaction  Has patient had a PCN reaction causing immediate rash, facial/tongue/throat swelling, SOB or lightheadedness with hypotension: unknown Has patient had a PCN reaction causing severe rash involving mucus membranes or skin necrosis: unknown Has patient had a PCN reaction that required hospitalization : unknown Has patient had a PCN reaction occurring within the last 10 years: no If all answers are "NO", then may proceed with Cephalosporin use.       Outpatient Encounter Medications as of 06/06/2020  Medication Sig  . docusate sodium (COLACE) 100 MG capsule Take 100 mg by mouth 2 (two) times daily as needed for mild constipation.  Marland Kitchen ibuprofen (ADVIL) 800 MG tablet Take 1 tablet (800 mg total) by mouth every 8 (eight) hours as needed.  . [DISCONTINUED] oxyCODONE (OXY IR/ROXICODONE) 5 MG immediate release tablet Take 1 tablet (5 mg total) by mouth every 6 (six) hours as needed for severe pain.   No facility-administered encounter medications on file as of 06/06/2020.     REVIEW OF SYSTEMS  : All other systems reviewed and negative except where noted in the History of Present Illness.   PHYSICAL EXAM: BP 130/78   Pulse 80   Ht 5\' 8"  (1.727 m)   Wt 153 lb 9.6 oz (69.7 kg)  BMI 23.35 kg/m  General: Well developed AA female in no acute distress Head: Normocephalic and atraumatic Eyes:  Sclerae anicteric, conjunctiva pink. Ears: Normal auditory acuity Lungs: Clear throughout to auscultation; no W/R/R. Heart: Regular rate and rhythm; no M/R/G. Abdomen: Soft, non-distended.  BS present.  Non-tender.  Laparotomy scar noted on abdomen from previous surgery. Musculoskeletal: Symmetrical with no gross deformities  Skin: No lesions on visible extremities Extremities: No edema  Neurological: Alert oriented x 4, grossly  non-focal Psychological:  Alert and cooperative. Normal mood and affect  ASSESSMENT AND PLAN: *40 year old female with history of small bowel obstruction and volvulus in November 2020 that required laparotomy now here with complaints of intermittent episodes of sudden onset severe abdominal bloating/distention associated with pain and followed by episodes of vomiting that have been happening intermittently, about twice a month, for the past 6 months or so.  Once vomiting occurs typically the symptoms completely resolve.  She's also had about a 12 pound weight loss, but reports that she continues to eat and have an appetite, but has been more conscious of what she is eating.  Sounds like she is having intermittent at least partial small bowel obstructions possibly related to adhesions/scar tissue.  Will obtain a CT scan of the abdomen and pelvis with contrast.  If it does not show anything then may need to have an abdominal x-ray performed acutely when she is having an episode.   CC:  No ref. provider found

## 2020-06-06 NOTE — Progress Notes (Signed)
Reviewed and agree with management plan.  Fadi Menter T. Sulma Ruffino, MD FACG [phone]  

## 2020-06-14 ENCOUNTER — Other Ambulatory Visit: Payer: Self-pay

## 2020-06-14 ENCOUNTER — Ambulatory Visit (HOSPITAL_COMMUNITY)
Admission: RE | Admit: 2020-06-14 | Discharge: 2020-06-14 | Disposition: A | Discharge status: Home / Self Care | Payer: BC Managed Care – PPO | Source: Ambulatory Visit | Attending: Gastroenterology | Admitting: Gastroenterology

## 2020-06-14 ENCOUNTER — Encounter (HOSPITAL_COMMUNITY): Payer: Self-pay

## 2020-06-14 DIAGNOSIS — R634 Abnormal weight loss: Secondary | ICD-10-CM | POA: Diagnosis not present

## 2020-06-14 DIAGNOSIS — R112 Nausea with vomiting, unspecified: Secondary | ICD-10-CM | POA: Diagnosis not present

## 2020-06-14 DIAGNOSIS — R140 Abdominal distension (gaseous): Secondary | ICD-10-CM | POA: Insufficient documentation

## 2020-06-14 DIAGNOSIS — Z8719 Personal history of other diseases of the digestive system: Secondary | ICD-10-CM | POA: Diagnosis not present

## 2020-06-14 DIAGNOSIS — R1084 Generalized abdominal pain: Secondary | ICD-10-CM | POA: Insufficient documentation

## 2020-06-14 DIAGNOSIS — R109 Unspecified abdominal pain: Secondary | ICD-10-CM | POA: Diagnosis not present

## 2020-06-14 DIAGNOSIS — R1110 Vomiting, unspecified: Secondary | ICD-10-CM | POA: Diagnosis not present

## 2020-06-14 MED ORDER — IOHEXOL 300 MG/ML  SOLN
100.0000 mL | Freq: Once | INTRAMUSCULAR | Status: AC | PRN
  Administered 2020-06-14: 100 mL via INTRAVENOUS

## 2020-06-22 DIAGNOSIS — Z01419 Encounter for gynecological examination (general) (routine) without abnormal findings: Secondary | ICD-10-CM | POA: Diagnosis not present

## 2020-06-22 DIAGNOSIS — I10 Essential (primary) hypertension: Secondary | ICD-10-CM | POA: Diagnosis not present

## 2020-06-22 DIAGNOSIS — Z6822 Body mass index (BMI) 22.0-22.9, adult: Secondary | ICD-10-CM | POA: Diagnosis not present

## 2020-07-12 DIAGNOSIS — R030 Elevated blood-pressure reading, without diagnosis of hypertension: Secondary | ICD-10-CM | POA: Diagnosis not present

## 2020-07-12 DIAGNOSIS — R42 Dizziness and giddiness: Secondary | ICD-10-CM | POA: Diagnosis not present

## 2020-08-04 DIAGNOSIS — N83201 Unspecified ovarian cyst, right side: Secondary | ICD-10-CM | POA: Diagnosis not present

## 2020-08-04 DIAGNOSIS — R1031 Right lower quadrant pain: Secondary | ICD-10-CM | POA: Diagnosis not present

## 2020-08-14 DIAGNOSIS — I10 Essential (primary) hypertension: Secondary | ICD-10-CM | POA: Diagnosis not present

## 2021-02-06 DIAGNOSIS — Z113 Encounter for screening for infections with a predominantly sexual mode of transmission: Secondary | ICD-10-CM | POA: Diagnosis not present

## 2021-02-06 DIAGNOSIS — N760 Acute vaginitis: Secondary | ICD-10-CM | POA: Diagnosis not present

## 2021-04-09 DIAGNOSIS — Z113 Encounter for screening for infections with a predominantly sexual mode of transmission: Secondary | ICD-10-CM | POA: Diagnosis not present

## 2021-04-09 DIAGNOSIS — N760 Acute vaginitis: Secondary | ICD-10-CM | POA: Diagnosis not present

## 2021-06-25 DIAGNOSIS — I10 Essential (primary) hypertension: Secondary | ICD-10-CM | POA: Diagnosis not present

## 2021-06-25 DIAGNOSIS — Z6823 Body mass index (BMI) 23.0-23.9, adult: Secondary | ICD-10-CM | POA: Diagnosis not present

## 2021-06-25 DIAGNOSIS — R319 Hematuria, unspecified: Secondary | ICD-10-CM | POA: Diagnosis not present

## 2021-06-25 DIAGNOSIS — Z01419 Encounter for gynecological examination (general) (routine) without abnormal findings: Secondary | ICD-10-CM | POA: Diagnosis not present

## 2021-06-25 DIAGNOSIS — Z1231 Encounter for screening mammogram for malignant neoplasm of breast: Secondary | ICD-10-CM | POA: Diagnosis not present

## 2021-06-25 DIAGNOSIS — Z1272 Encounter for screening for malignant neoplasm of vagina: Secondary | ICD-10-CM | POA: Diagnosis not present

## 2021-08-27 DIAGNOSIS — J019 Acute sinusitis, unspecified: Secondary | ICD-10-CM | POA: Diagnosis not present

## 2021-08-27 DIAGNOSIS — H9201 Otalgia, right ear: Secondary | ICD-10-CM | POA: Diagnosis not present

## 2021-08-27 DIAGNOSIS — R0981 Nasal congestion: Secondary | ICD-10-CM | POA: Diagnosis not present

## 2021-08-27 DIAGNOSIS — R6884 Jaw pain: Secondary | ICD-10-CM | POA: Diagnosis not present

## 2021-12-20 IMAGING — CT CT ABD-PELV W/ CM
2 of 4 series · 15 of 46 positions shown, 17 images · IV contrast (OMNIPAQUE)
Comparison: Multiple exams, including 03/30/2019

CLINICAL DATA: Abdominal pain with nausea and vomiting for 5-6
months. Weight loss over the past 4 months. Previous volvulus
secondary to small bowel adhesions to the vaginal cuff with
exploratory laparotomy and small-bowel resection in 03/30/2019.

EXAM:
CT ABDOMEN AND PELVIS WITH CONTRAST
TECHNIQUE: Multidetector CT imaging of the abdomen and pelvis was performed
using the standard protocol following bolus administration of
intravenous contrast.
CONTRAST:  100mL OMNIPAQUE IOHEXOL 300 MG/ML  SOLN

[Series 2: axial st · axial · 0.73mm/px · z∈[+1028,+1393]mm · 12 of 83 slices shown, 14 images]
[im 5/83  soft-tissue]
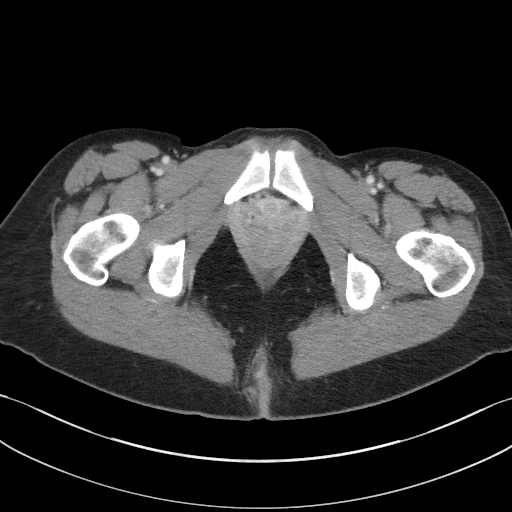
[im 5/83  bone]
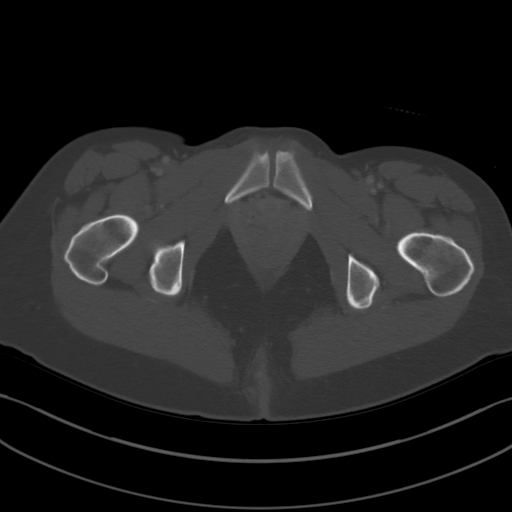
[im 14/83  soft-tissue]
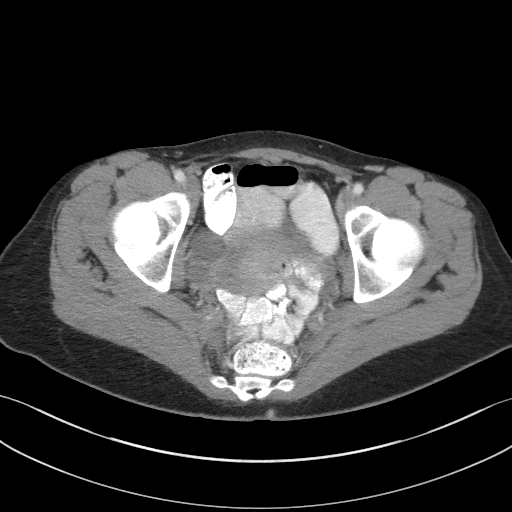
[im 19/83  soft-tissue]
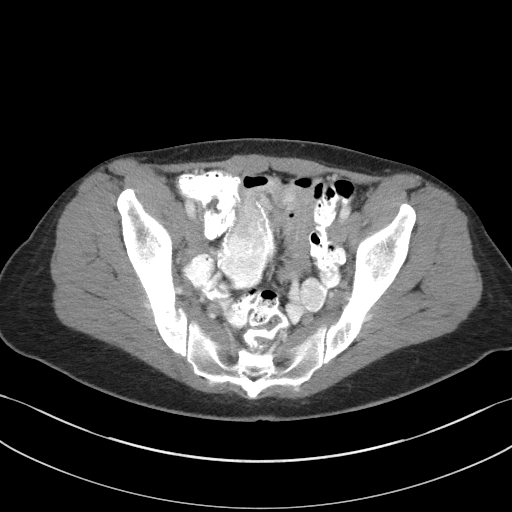
[im 23/83  soft-tissue]
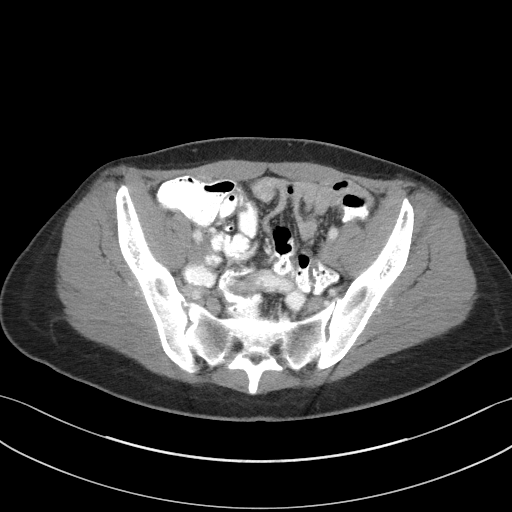
[im 32/83  soft-tissue]
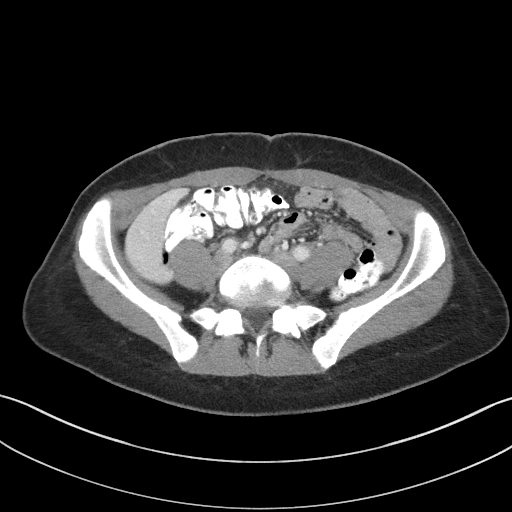
[im 37/83  soft-tissue]
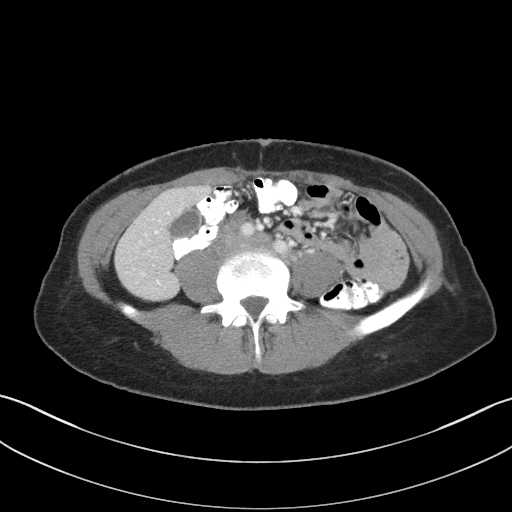
[im 46/83  soft-tissue]
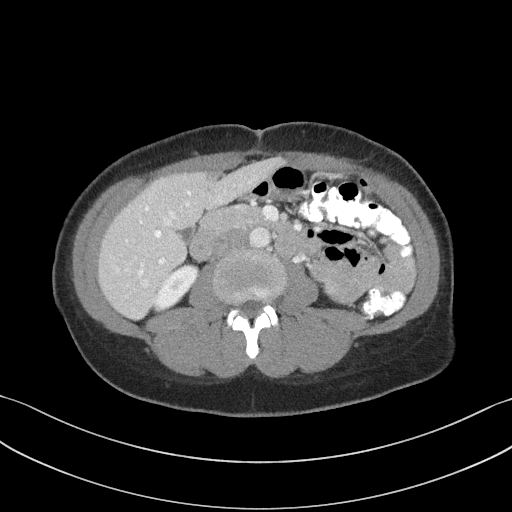
[im 51/83  soft-tissue]
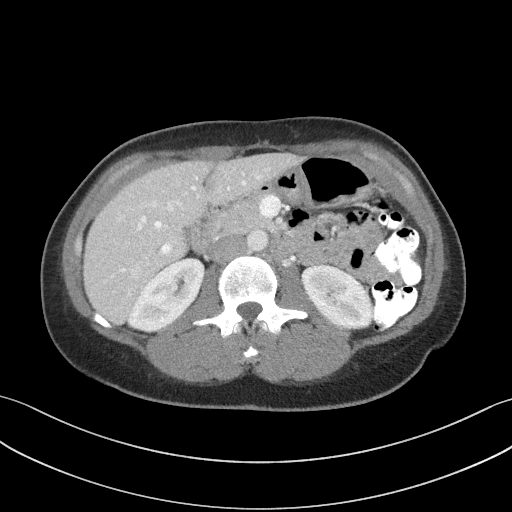
[im 60/83  soft-tissue]
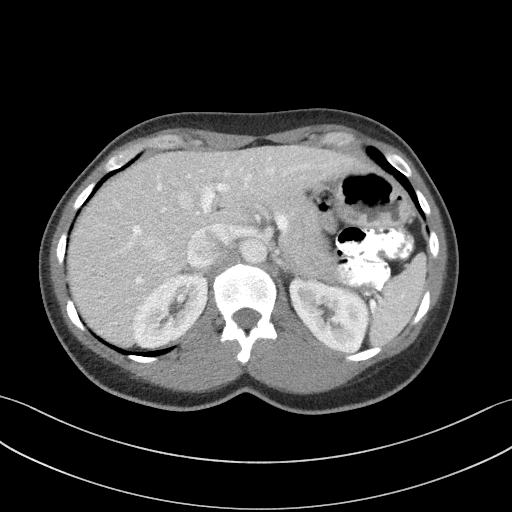
[im 60/83  bone]
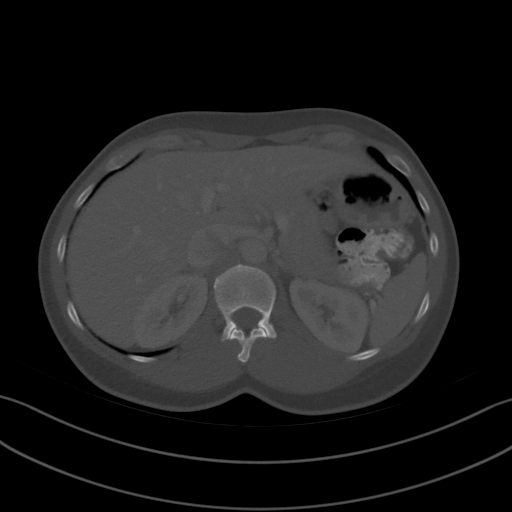
[im 64/83  soft-tissue]
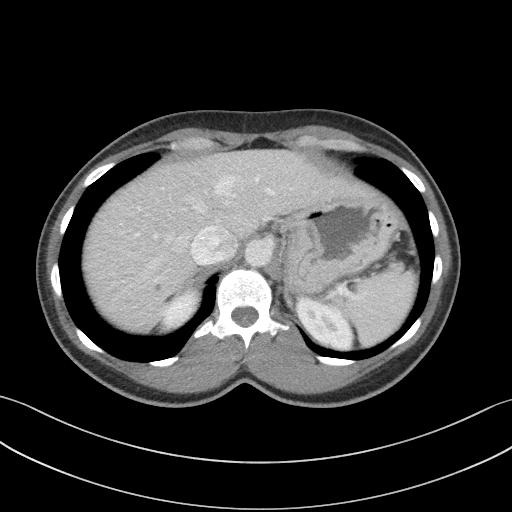
[im 69/83  soft-tissue]
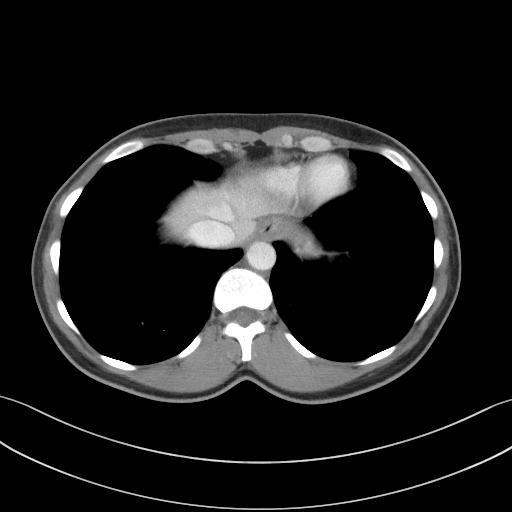
[im 78/83  soft-tissue]
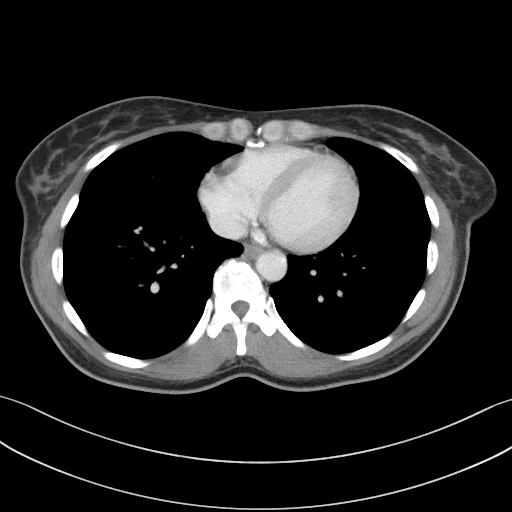

[Series 4: coronal st · coronal · 0.68mm/px · 3 of 73 slices shown]
[im 25/73  soft-tissue]
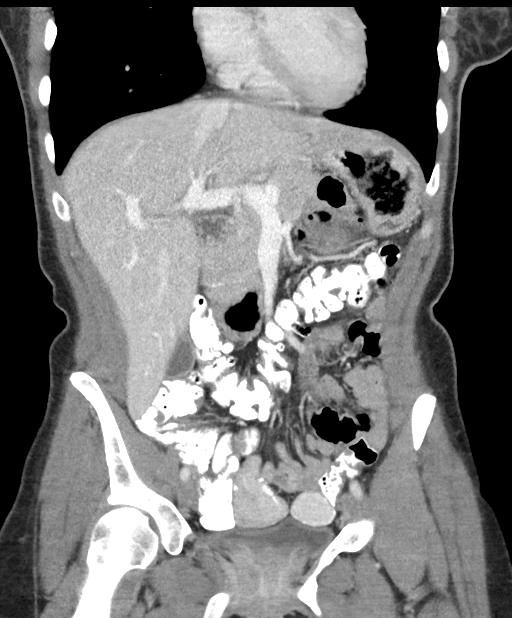
[im 33/73  soft-tissue]
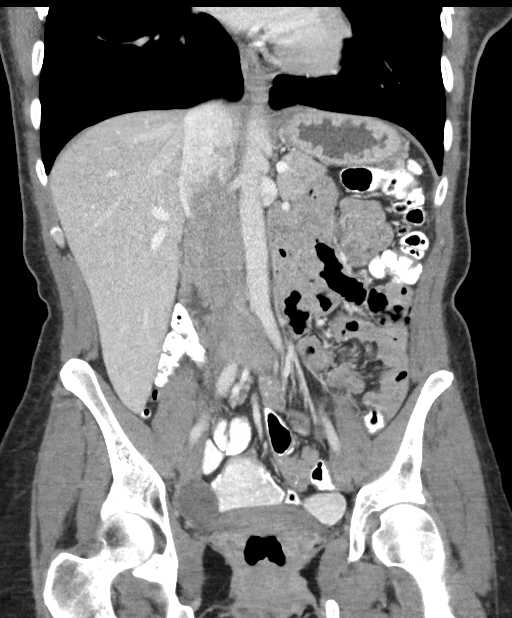
[im 41/73  soft-tissue]
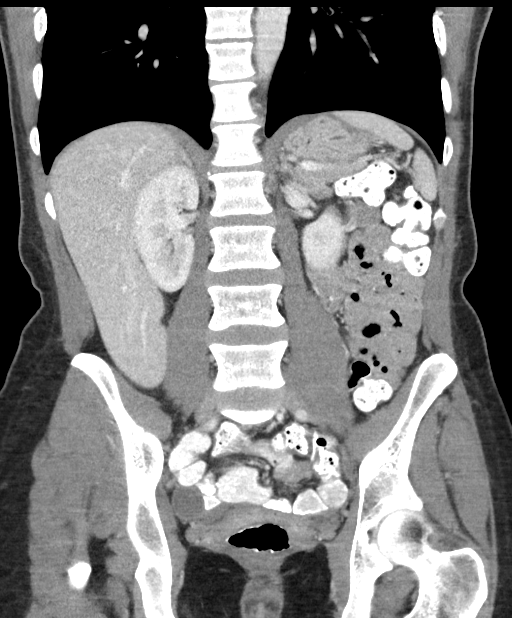

[15 of 46 positions shown; findings below may reference images not displayed]

FINDINGS: Lower chest: Unremarkable

Hepatobiliary: No significant abnormality. Gallbladder unremarkable.

Pancreas: Suspected pancreas divisum.

Spleen: Unremarkable

Adrenals/Urinary Tract: Unremarkable

Stomach/Bowel: Orally administered contrast extends through to the
rectum. Normal appendix.

Along the anastomotic staple line in the central pelvis, the small
bowel loop is somewhat capacious at about 4.4 by 3.3 cm, although
this may mainly be related to the surgically created common channel
rather than pathologic dilatation, particularly in light of the lack
of other dilated small bowel in the distal transit of the oral
contrast. No obvious angulated loops of bowel or swirling of the
mesentery at this time.

Vascular/Lymphatic: Unremarkable

Reproductive: Two hypodense lesions along the right ovary/adnexa,
one measuring 2.8 by 2.4 cm on image 86 of series 2 and the other
measuring 1.8 by 2.0 cm, without specific worrisome characteristics.
There is potential mild complexity of these lesions, with mildly
greater than fluid density but with internal homogeneity. The uterus
is absent.

Other: No supplemental non-categorized findings.

Musculoskeletal: Unremarkable
IMPRESSION: 1. Along the anastomotic staple line in the central pelvis, the
small bowel loop is somewhat capacious at about 4.4 by 3.3 cm,
although this is probably related to the surgically created common
channel rather than pathologic dilatation, particularly in light of
the lack of other dilated small bowel in the distal transit of the
oral contrast. No obvious angulated loops of bowel or swirling of
the mesentery at this time.
2. Two hypodense but mildly complex lesions along the right
ovary/adnexa, one measuring 2.8 by 2.4 cm and the other measuring
1.8 by 2.0 cm, without specific worrisome characteristics. In the
absence of symptoms specifically referable to the right pelvis, no
further imaging workup is necessary based on current guidelines.
3. Suspected pancreas divisum.

## 2022-07-01 DIAGNOSIS — Z01419 Encounter for gynecological examination (general) (routine) without abnormal findings: Secondary | ICD-10-CM | POA: Diagnosis not present

## 2022-07-01 DIAGNOSIS — Z6824 Body mass index (BMI) 24.0-24.9, adult: Secondary | ICD-10-CM | POA: Diagnosis not present

## 2022-07-01 DIAGNOSIS — Z1231 Encounter for screening mammogram for malignant neoplasm of breast: Secondary | ICD-10-CM | POA: Diagnosis not present

## 2023-01-29 DIAGNOSIS — B3731 Acute candidiasis of vulva and vagina: Secondary | ICD-10-CM | POA: Diagnosis not present

## 2023-01-29 DIAGNOSIS — R319 Hematuria, unspecified: Secondary | ICD-10-CM | POA: Diagnosis not present

## 2023-01-29 DIAGNOSIS — N898 Other specified noninflammatory disorders of vagina: Secondary | ICD-10-CM | POA: Diagnosis not present

## 2023-01-29 DIAGNOSIS — N760 Acute vaginitis: Secondary | ICD-10-CM | POA: Diagnosis not present

## 2023-04-25 DIAGNOSIS — H6090 Unspecified otitis externa, unspecified ear: Secondary | ICD-10-CM | POA: Diagnosis not present

## 2023-04-25 DIAGNOSIS — H6120 Impacted cerumen, unspecified ear: Secondary | ICD-10-CM | POA: Diagnosis not present

## 2023-07-08 DIAGNOSIS — N951 Menopausal and female climacteric states: Secondary | ICD-10-CM | POA: Diagnosis not present

## 2023-07-08 DIAGNOSIS — Z1231 Encounter for screening mammogram for malignant neoplasm of breast: Secondary | ICD-10-CM | POA: Diagnosis not present

## 2023-07-08 DIAGNOSIS — Z01419 Encounter for gynecological examination (general) (routine) without abnormal findings: Secondary | ICD-10-CM | POA: Diagnosis not present

## 2023-07-08 DIAGNOSIS — Z6825 Body mass index (BMI) 25.0-25.9, adult: Secondary | ICD-10-CM | POA: Diagnosis not present

## 2024-03-18 DIAGNOSIS — M542 Cervicalgia: Secondary | ICD-10-CM | POA: Diagnosis not present
